# Patient Record
Sex: Male | Born: 1965
Health system: Southern US, Community
[De-identification: ages and names within clinical notes are randomized; demographics above are authoritative.]

## PROBLEM LIST (undated history)

## (undated) DIAGNOSIS — N419 Inflammatory disease of prostate, unspecified: Secondary | ICD-10-CM

## (undated) HISTORY — PX: HERNIA REPAIR: SHX51

## (undated) HISTORY — PX: OTHER SURGICAL HISTORY: SHX169

## (undated) HISTORY — PX: ACHILLES TENDON REPAIR: SUR1153

## (undated) HISTORY — DX: Inflammatory disease of prostate, unspecified: N41.9

## (undated) HISTORY — PX: TONSILLECTOMY AND ADENOIDECTOMY: SUR1326

---

## 2004-10-05 ENCOUNTER — Ambulatory Visit: Payer: Self-pay | Admitting: Family Medicine

## 2006-06-21 ENCOUNTER — Ambulatory Visit: Payer: Self-pay | Admitting: General Surgery

## 2012-12-27 DIAGNOSIS — N138 Other obstructive and reflux uropathy: Secondary | ICD-10-CM | POA: Insufficient documentation

## 2014-05-30 ENCOUNTER — Ambulatory Visit: Payer: Self-pay | Admitting: Family Medicine

## 2014-05-30 LAB — LIPID PANEL
Cholesterol: 152 mg/dL (ref 0–200)
HDL: 50 mg/dL (ref 35–70)
LDL Cholesterol: 82 mg/dL
LDL/HDL RATIO: 1.6
TRIGLYCERIDES: 100 mg/dL (ref 40–160)

## 2014-05-30 LAB — BASIC METABOLIC PANEL
BUN: 12 mg/dL (ref 4–21)
Creatinine: 1.1 mg/dL (ref 0.6–1.3)
Glucose: 104 mg/dL
Potassium: 4.5 mmol/L (ref 3.4–5.3)
SODIUM: 140 mmol/L (ref 137–147)

## 2014-05-30 LAB — CBC AND DIFFERENTIAL
HCT: 48 % (ref 41–53)
HEMOGLOBIN: 17 g/dL (ref 13.5–17.5)
NEUTROS ABS: 4 /uL
Platelets: 201 10*3/uL (ref 150–399)
WBC: 6.4 10^3/mL

## 2014-05-30 LAB — HEPATIC FUNCTION PANEL
ALT: 36 U/L (ref 10–40)
AST: 22 U/L (ref 14–40)
Alkaline Phosphatase: 50 U/L (ref 25–125)
BILIRUBIN, TOTAL: 0.4 mg/dL

## 2014-05-30 LAB — TSH: TSH: 2.42 u[IU]/mL (ref 0.41–5.90)

## 2014-05-30 LAB — PSA: PSA: 0.8

## 2015-01-13 ENCOUNTER — Other Ambulatory Visit: Payer: Self-pay | Admitting: Family Medicine

## 2015-01-21 ENCOUNTER — Other Ambulatory Visit: Payer: Self-pay | Admitting: Family Medicine

## 2015-04-29 ENCOUNTER — Other Ambulatory Visit: Payer: Self-pay | Admitting: Family Medicine

## 2015-05-27 DIAGNOSIS — J309 Allergic rhinitis, unspecified: Secondary | ICD-10-CM | POA: Insufficient documentation

## 2015-05-27 DIAGNOSIS — G4700 Insomnia, unspecified: Secondary | ICD-10-CM | POA: Insufficient documentation

## 2015-05-27 DIAGNOSIS — F4320 Adjustment disorder, unspecified: Secondary | ICD-10-CM | POA: Insufficient documentation

## 2015-05-27 DIAGNOSIS — K219 Gastro-esophageal reflux disease without esophagitis: Secondary | ICD-10-CM | POA: Insufficient documentation

## 2015-05-27 DIAGNOSIS — K589 Irritable bowel syndrome without diarrhea: Secondary | ICD-10-CM | POA: Insufficient documentation

## 2015-05-27 DIAGNOSIS — N400 Enlarged prostate without lower urinary tract symptoms: Secondary | ICD-10-CM | POA: Insufficient documentation

## 2015-06-04 ENCOUNTER — Ambulatory Visit (INDEPENDENT_AMBULATORY_CARE_PROVIDER_SITE_OTHER): Payer: BLUE CROSS/BLUE SHIELD | Admitting: Family Medicine

## 2015-06-04 ENCOUNTER — Ambulatory Visit
Admission: RE | Admit: 2015-06-04 | Discharge: 2015-06-04 | Disposition: A | Discharge status: Home / Self Care | Payer: BLUE CROSS/BLUE SHIELD | Source: Ambulatory Visit | Attending: Family Medicine | Admitting: Family Medicine

## 2015-06-04 ENCOUNTER — Encounter: Payer: Self-pay | Admitting: Family Medicine

## 2015-06-04 VITALS — BP 118/84 | HR 68 | Temp 98.1°F | Resp 14 | Ht 71.75 in | Wt 223.0 lb

## 2015-06-04 DIAGNOSIS — Z0000 Encounter for general adult medical examination without abnormal findings: Secondary | ICD-10-CM

## 2015-06-04 DIAGNOSIS — Z125 Encounter for screening for malignant neoplasm of prostate: Secondary | ICD-10-CM | POA: Diagnosis not present

## 2015-06-04 DIAGNOSIS — M5432 Sciatica, left side: Secondary | ICD-10-CM

## 2015-06-04 DIAGNOSIS — M549 Dorsalgia, unspecified: Secondary | ICD-10-CM | POA: Insufficient documentation

## 2015-06-04 DIAGNOSIS — N411 Chronic prostatitis: Secondary | ICD-10-CM | POA: Diagnosis not present

## 2015-06-04 LAB — POCT URINALYSIS DIPSTICK
Bilirubin, UA: NEGATIVE
Blood, UA: NEGATIVE
GLUCOSE UA: NEGATIVE
Ketones, UA: NEGATIVE
Nitrite, UA: NEGATIVE
PROTEIN UA: NEGATIVE
SPEC GRAV UA: 1.01
UROBILINOGEN UA: 0.2
pH, UA: 6

## 2015-06-04 MED ORDER — NAPROXEN 500 MG PO TABS: 500.0000 mg | ORAL_TABLET | Freq: Two times a day (BID) | ORAL | Status: DC

## 2015-06-04 MED ORDER — CYCLOBENZAPRINE HCL 10 MG PO TABS: 10.0000 mg | ORAL_TABLET | Freq: Every day | ORAL | Status: DC

## 2015-06-04 NOTE — Progress Notes (Signed)
Patient ID: Shawn BeachRichard N Vasquez, male   DOB: 05/03/1966, 49 y.o.   MRN: [id number]  Visit Date: 06/04/2015  Today's Provider: Megan Mansichard Gilbert Jr, MD   Chief Complaint  Patient presents with  . Annual Exam   Subjective:  Shawn Vasquez is a 49 y.o. male who presents today for health maintenance and complete physical. He feels well. 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 He reports he is sleeping well.   Review of Systems  Constitutional: Negative.   HENT: Negative.   Eyes: Negative.   Respiratory: Negative.   Cardiovascular: Negative.   Gastrointestinal: Negative.   Endocrine: Negative.   Genitourinary: Positive for frequency.  Musculoskeletal: Positive for back pain (sciatica on the left side.).  Skin: Negative.   Allergic/Immunologic: Negative.   Neurological: Negative.   Hematological: Negative.   Psychiatric/Behavioral: Negative.     Social History   Social History  . Marital Status: Married    Spouse Name: N/A  . Number of Children: N/A  . Years of Education: N/A   Occupational History  . Not on file.   Social History Main Topics  . Smoking status: Never Smoker   . Smokeless tobacco: Never Used  . Alcohol Use: Yes     Comment: occasionally  . Drug Use: No  . Sexual Activity: Yes   Other Topics Concern  . Not on file   Social History Narrative    Patient Active Problem List   Diagnosis Date Noted  . Adaptation reaction 05/27/2015  . Benign fibroma of prostate 05/27/2015  . Gastro-esophageal reflux disease without esophagitis 05/27/2015  . Adaptive colitis 05/27/2015  . Cannot sleep 05/27/2015  . Allergic rhinitis 05/27/2015  . Benign prostatic hyperplasia with urinary obstruction 12/27/2012    Past Surgical History  Procedure Laterality Date  . Hernia repair      inguinal and umbilical  . Achilles tendon repair    . Other surgical history      bronchial cleft cyst removed  . Tonsillectomy and adenoidectomy      His family  history includes Colon cancer in his paternal grandmother; Dementia in his maternal grandmother; Heart attack in his father; Lung cancer in his father; Prostate cancer in his maternal uncle; Seizures in his father; Skin cancer in his paternal grandfather.    Outpatient Prescriptions Prior to Visit  Medication Sig Dispense Refill  . ALPRAZolam (XANAX) 0.5 MG tablet Take by mouth.    . cetirizine (ZYRTEC ALLERGY) 10 MG tablet Take by mouth.    . fluticasone (FLONASE) 50 MCG/ACT nasal spray Place into the nose.    . naproxen (NAPROSYN) 500 MG tablet Take by mouth.    Marland Kitchen. omeprazole (PRILOSEC) 20 MG capsule TAKE 1 CAPSULE BY MOUTH EVERY MORNING 30 capsule 5  . tamsulosin (FLOMAX) 0.4 MG CAPS capsule 1 CAPSULE, ORAL, DAILY (Patient not taking: Reported on 06/04/2015) 90 capsule 3   No facility-administered medications prior to visit.    Patient Care Team: Maple Hudsonichard L Gilbert Jr., MD as PCP - General (Family Medicine)     Objective:   Vitals:  Filed Vitals:   06/04/15 0916  BP: 118/84  Pulse: 68  Temp: 98.1 F (36.7 C)  Resp: 14  Height: 5' 11.75" (1.822 m)  Weight: 223 lb (101.152 kg)    Physical Exam  Constitutional: He is oriented to person, place, and time. He appears well-developed and well-nourished.  HENT:  Head: Normocephalic and atraumatic.  Right Ear: External ear normal.  Left Ear:  External ear normal.  Nose: Nose normal.  Mouth/Throat: Oropharynx is clear and moist.  Eyes: Conjunctivae and EOM are normal. Pupils are equal, round, and reactive to light.  Neck: Normal range of motion. Neck supple.  Cardiovascular: Normal rate, regular rhythm, normal heart sounds and intact distal pulses.   Pulmonary/Chest: Effort normal and breath sounds normal.  Abdominal: Soft.  Genitourinary: Rectum normal, prostate normal and penis normal.  Musculoskeletal: Normal range of motion.  Neurological: He is alert and oriented to person, place, and time. He has normal reflexes. He displays  normal reflexes. No cranial nerve deficit. He exhibits normal muscle tone. Coordination normal.  Straight leg is positive on the left. Strength is normal in both legs and sensation is normal in both legs.  Skin: Skin is warm and dry.  Psychiatric: He has a normal mood and affect. His behavior is normal. Judgment and thought content normal.     Depression Screen PHQ 2/9 Scores 06/04/2015  PHQ - 2 Score 0      Assessment & Plan:   1. Annual physical exam  - PSA - TSH - POCT urinalysis dipstick - CBC with Differential/Platelet - Lipid Panel With LDL/HDL Ratio - Comprehensive metabolic panel  2. Sciatica of left side Obtain x-ray of LS-spine. Refer back to chiropractor after 2-3 weeks of treatment. He will also consider acupuncture then. Will see me back on a when necessary basis. He told him to get back in if he has weakness or numbness. - cyclobenzaprine (FLEXERIL) 10 MG tablet; Take 1 tablet (10 mg total) by mouth at bedtime.  Dispense: 30 tablet; Refill: 5 - naproxen (NAPROSYN) 500 MG tablet; Take 1 tablet (500 mg total) by mouth 2 (two) times daily with a meal.  Dispense: 60 tablet; Refill: 5 - DG Lumbar Spine 2-3 Views; Future  3. Prostate cancer screening  - PSA  4. Chronic prostatitis Check urine culture.  - Urine culture  I have done the exam and reviewed the above chart and it is accurate to the best of my knowledge.

## 2015-06-05 LAB — CBC WITH DIFFERENTIAL/PLATELET
BASOS ABS: 0 10*3/uL (ref 0.0–0.2)
Basos: 0 %
EOS (ABSOLUTE): 0.3 10*3/uL (ref 0.0–0.4)
EOS: 4 %
HEMATOCRIT: 47.5 % (ref 37.5–51.0)
HEMOGLOBIN: 16.8 g/dL (ref 12.6–17.7)
Immature Grans (Abs): 0 10*3/uL (ref 0.0–0.1)
Immature Granulocytes: 1 %
LYMPHS ABS: 1.3 10*3/uL (ref 0.7–3.1)
Lymphs: 21 %
MCH: 31.9 pg (ref 26.6–33.0)
MCHC: 35.4 g/dL (ref 31.5–35.7)
MCV: 90 fL (ref 79–97)
MONOS ABS: 0.6 10*3/uL (ref 0.1–0.9)
Monocytes: 9 %
NEUTROS ABS: 4.2 10*3/uL (ref 1.4–7.0)
Neutrophils: 65 %
Platelets: 192 10*3/uL (ref 150–379)
RBC: 5.27 x10E6/uL (ref 4.14–5.80)
RDW: 13.4 % (ref 12.3–15.4)
WBC: 6.5 10*3/uL (ref 3.4–10.8)

## 2015-06-05 LAB — PSA: Prostate Specific Ag, Serum: 1 ng/mL (ref 0.0–4.0)

## 2015-06-05 LAB — COMPREHENSIVE METABOLIC PANEL
A/G RATIO: 1.7 (ref 1.1–2.5)
ALT: 38 IU/L (ref 0–44)
AST: 24 IU/L (ref 0–40)
Albumin: 4.5 g/dL (ref 3.5–5.5)
Alkaline Phosphatase: 46 IU/L (ref 39–117)
BUN/Creatinine Ratio: 14 (ref 9–20)
BUN: 13 mg/dL (ref 6–24)
Bilirubin Total: 0.6 mg/dL (ref 0.0–1.2)
CALCIUM: 9.3 mg/dL (ref 8.7–10.2)
CO2: 25 mmol/L (ref 18–29)
Chloride: 98 mmol/L (ref 97–106)
Creatinine, Ser: 0.95 mg/dL (ref 0.76–1.27)
GFR, EST AFRICAN AMERICAN: 109 mL/min/{1.73_m2} (ref >59)
GFR, EST NON AFRICAN AMERICAN: 94 mL/min/{1.73_m2} (ref >59)
Globulin, Total: 2.7 g/dL (ref 1.5–4.5)
Glucose: 105 mg/dL — ABNORMAL HIGH (ref 65–99)
POTASSIUM: 4.6 mmol/L (ref 3.5–5.2)
SODIUM: 140 mmol/L (ref 136–144)
TOTAL PROTEIN: 7.2 g/dL (ref 6.0–8.5)

## 2015-06-05 LAB — LIPID PANEL WITH LDL/HDL RATIO
Cholesterol, Total: 158 mg/dL (ref 100–199)
HDL: 45 mg/dL (ref >39)
LDL CALC: 82 mg/dL (ref 0–99)
LDL/HDL RATIO: 1.8 ratio (ref 0.0–3.6)
TRIGLYCERIDES: 154 mg/dL — AB (ref 0–149)
VLDL Cholesterol Cal: 31 mg/dL (ref 5–40)

## 2015-06-05 LAB — TSH: TSH: 1.59 u[IU]/mL (ref 0.450–4.500)

## 2015-06-05 LAB — URINE CULTURE: ORGANISM ID, BACTERIA: NO GROWTH

## 2015-10-06 ENCOUNTER — Other Ambulatory Visit: Payer: Self-pay | Admitting: Family Medicine

## 2015-12-24 DIAGNOSIS — H524 Presbyopia: Secondary | ICD-10-CM | POA: Diagnosis not present

## 2016-04-12 ENCOUNTER — Other Ambulatory Visit: Payer: Self-pay | Admitting: Family Medicine

## 2016-04-13 MED ORDER — TAMSULOSIN HCL 0.4 MG PO CAPS: 0.4000 mg | ORAL_CAPSULE | Freq: Every day | ORAL | 3 refills | Status: DC

## 2016-04-13 NOTE — Addendum Note (Signed)
Addended by: Miachel RouxALEKSANDROVA, Tonette Koehne V on: 04/13/2016 09:02 AM   Modules accepted: Orders

## 2016-06-07 ENCOUNTER — Encounter: Payer: BLUE CROSS/BLUE SHIELD | Admitting: Family Medicine

## 2016-06-07 ENCOUNTER — Ambulatory Visit (INDEPENDENT_AMBULATORY_CARE_PROVIDER_SITE_OTHER): Payer: BLUE CROSS/BLUE SHIELD | Admitting: Family Medicine

## 2016-06-07 VITALS — BP 118/78 | HR 78 | Temp 97.6°F | Resp 16 | Ht 71.5 in | Wt 225.0 lb

## 2016-06-07 DIAGNOSIS — K219 Gastro-esophageal reflux disease without esophagitis: Secondary | ICD-10-CM | POA: Diagnosis not present

## 2016-06-07 DIAGNOSIS — Z1211 Encounter for screening for malignant neoplasm of colon: Secondary | ICD-10-CM | POA: Diagnosis not present

## 2016-06-07 DIAGNOSIS — Z0000 Encounter for general adult medical examination without abnormal findings: Secondary | ICD-10-CM | POA: Diagnosis not present

## 2016-06-07 DIAGNOSIS — Z125 Encounter for screening for malignant neoplasm of prostate: Secondary | ICD-10-CM | POA: Diagnosis not present

## 2016-06-07 LAB — POCT URINALYSIS DIPSTICK
BILIRUBIN UA: NEGATIVE
Blood, UA: NEGATIVE
GLUCOSE UA: NEGATIVE
KETONES UA: NEGATIVE
LEUKOCYTES UA: NEGATIVE
Nitrite, UA: NEGATIVE
PROTEIN UA: NEGATIVE
SPEC GRAV UA: 1.015
Urobilinogen, UA: NEGATIVE
pH, UA: 5

## 2016-06-07 MED ORDER — RANITIDINE HCL 150 MG PO CAPS: 150.0000 mg | ORAL_CAPSULE | Freq: Two times a day (BID) | ORAL | 12 refills | Status: DC

## 2016-06-07 MED ORDER — RANITIDINE HCL 150 MG PO CAPS: 150.0000 mg | ORAL_CAPSULE | Freq: Two times a day (BID) | ORAL | 3 refills | Status: DC

## 2016-06-07 NOTE — Progress Notes (Signed)
Patient: Shawn Vasquez, Male    DOB: 09/28/1965, 50 y.o.   MRN: [id number] Visit Date: 06/07/2016  Today's Provider: Megan Mansichard Victorious Kundinger Jr, MD   Chief Complaint  Patient presents with  . Annual Exam   Subjective:  Shawn BeachRichard N Cissell is a 50 y.o. male who presents today for health maintenance and complete physical. He feels well. He reports exercising daily. He reports he is sleeping well.  Immunization History  Administered Date(s) Administered  . Tdap 05/30/2014     Review of Systems  Constitutional: Negative.   HENT: Positive for congestion.   Eyes: Negative.   Respiratory: Negative.   Cardiovascular: Negative.   Gastrointestinal: Negative.   Endocrine: Negative.   Genitourinary: Negative.   Musculoskeletal: Negative.   Skin: Negative.   Allergic/Immunologic: Negative.   Neurological: Negative.   Hematological: Negative.   Psychiatric/Behavioral: Negative.     Social History   Social History  . Marital status: Married    Spouse name: N/A  . Number of children: N/A  . Years of education: N/A   Occupational History  . Not on file.   Social History Main Topics  . Smoking status: Never Smoker  . Smokeless tobacco: Never Used  . Alcohol use Yes     Comment: occasionally  . Drug use: No  . Sexual activity: Yes   Other Topics Concern  . Not on file   Social History Narrative  . No narrative on file    Patient Active Problem List   Diagnosis Date Noted  . Adaptation reaction 05/27/2015  . Benign fibroma of prostate 05/27/2015  . Gastro-esophageal reflux disease without esophagitis 05/27/2015  . Adaptive colitis 05/27/2015  . Cannot sleep 05/27/2015  . Allergic rhinitis 05/27/2015  . Benign prostatic hyperplasia with urinary obstruction 12/27/2012    Past Surgical History:  Procedure Laterality Date  . ACHILLES TENDON REPAIR    . HERNIA REPAIR     inguinal and umbilical  . OTHER SURGICAL HISTORY     bronchial cleft cyst removed  . TONSILLECTOMY AND  ADENOIDECTOMY      His family history includes Colon cancer in his paternal grandmother; Dementia in his maternal grandmother; Heart attack in his father; Lung cancer in his father; Prostate cancer in his maternal uncle; Seizures in his father; Skin cancer in his paternal grandfather.     Outpatient Encounter Prescriptions as of 06/07/2016  Medication Sig Note  . ALPRAZolam (XANAX) 0.5 MG tablet Take by mouth. 05/27/2015: Medication taken as needed. appt august/sept,please Received from: [facility]  . cetirizine (ZYRTEC ALLERGY) 10 MG tablet Take by mouth. 05/27/2015: Received from: [facility]  . cyclobenzaprine (FLEXERIL) 10 MG tablet Take 1 tablet (10 mg total) by mouth at bedtime.   . fluticasone (FLONASE) 50 MCG/ACT nasal spray 1-2 SPRAYS IN EACH NOSTRIL DAILY AS NEEDED   . naproxen (NAPROSYN) 500 MG tablet Take 1 tablet (500 mg total) by mouth 2 (two) times daily with a meal.   . omeprazole (PRILOSEC) 20 MG capsule TAKE 1 CAPSULE BY MOUTH EVERY MORNING   . tamsulosin (FLOMAX) 0.4 MG CAPS capsule Take 1 capsule (0.4 mg total) by mouth daily.   . [DISCONTINUED] naproxen (NAPROSYN) 500 MG tablet Take by mouth. 05/27/2015: Medication taken as needed.  Received from[facility]   No facility-administered encounter medications on file as of 06/07/2016.     Patient Care Team: Maple Hudsonichard L Pernell Dikes Jr., MD as PCP - General (Family Medicine)      Objective:  Vitals:  Vitals:   06/07/16 0851  BP: 118/78  Pulse: 78  Resp: 16  Temp: 97.6 F (36.4 C)  TempSrc: Oral  Weight: 225 lb (102.1 kg)  Height: 5' 11.5" (1.816 m)    Physical Exam  Constitutional: He is oriented to person, place, and time. He appears well-developed and well-nourished.  HENT:  Head: Normocephalic and atraumatic.  Right Ear: External ear normal.  Left Ear: External ear normal.  Nose: Nose normal.  Mouth/Throat: Oropharynx is clear and moist.  Eyes:  Conjunctivae and EOM are normal. Pupils are equal, round, and reactive to light.  Neck: Normal range of motion. Neck supple.  Cardiovascular: Normal rate, regular rhythm, normal heart sounds and intact distal pulses.   Pulmonary/Chest: Effort normal and breath sounds normal.  Abdominal: Soft. Bowel sounds are normal.  Genitourinary: Rectum normal, prostate normal and penis normal.  Musculoskeletal: Normal range of motion.  Neurological: He is alert and oriented to person, place, and time. He has normal reflexes.  Skin: Skin is warm and dry.  Psychiatric: He has a normal mood and affect. His behavior is normal. Judgment and thought content normal.     Depression Screen PHQ 2/9 Scores 06/07/2016 06/04/2015  PHQ - 2 Score 0 0   Current Exercise Habits: The patient has a physically strenous job, but has no regular exercise apart from work.    Fall Risk  06/07/2016 06/04/2015  Falls in the past year? Yes No   Functional Status Survey: Is the patient deaf or have difficulty hearing?: No Does the patient have difficulty seeing, even when wearing glasses/contacts?: No Does the patient have difficulty concentrating, remembering, or making decisions?: No Does the patient have difficulty walking or climbing stairs?: No Does the patient have difficulty dressing or bathing?: No Does the patient have difficulty doing errands alone such as visiting a doctor's office or shopping?: No  .   Assessment & Plan:     Routine Health Maintenance and Physical Exam  Exercise Activities and Dietary recommendations Goals    None      Immunization History  Administered Date(s) Administered  . Tdap 05/30/2014    Health Maintenance  Topic Date Due  . HIV Screening  08/03/1981  . INFLUENZA VACCINE  03/01/2016  . TETANUS/TDAP  05/30/2024     Discussed health benefits of physical activity, and encouraged him to engage in regular exercise appropriate for his age and condition.    1. Annual  physical exam  - CBC with Differential/Platelet - Comprehensive metabolic panel - Lipid Panel With LDL/HDL Ratio - TSH  2. Prostate cancer screening  - PSA  3. Colon cancer screening   4. Gastroesophageal reflux disease, esophagitis presence not specified  - ranitidine (ZANTAC) 150 MG capsule; Take 1 capsule (150 mg total) by mouth 2 (two) times daily.  Dispense: 180 capsule; Refill: 3    HPI, Exam and A&P Transcribed under the direction and in the presence of Julieanne Mansonichard Kynzli Rease, Montez HagemanJr., MD. Electronically Signed: Janey GreaserElena DeSanto, RMA I have done the exam and reviewed the chart and it is accurate to the best of my knowledge. Julieanne Mansonichard Elliot Simoneaux M.D. [facility]

## 2016-06-08 LAB — TSH: TSH: 1.64 u[IU]/mL (ref 0.450–4.500)

## 2016-06-08 LAB — CBC WITH DIFFERENTIAL/PLATELET
Basophils Absolute: 0 10*3/uL (ref 0.0–0.2)
Basos: 0 %
EOS (ABSOLUTE): 0.3 10*3/uL (ref 0.0–0.4)
Eos: 3 %
Hematocrit: 47.1 % (ref 37.5–51.0)
Hemoglobin: 16.9 g/dL (ref 12.6–17.7)
IMMATURE GRANULOCYTES: 0 %
Immature Grans (Abs): 0 10*3/uL (ref 0.0–0.1)
LYMPHS ABS: 1.6 10*3/uL (ref 0.7–3.1)
Lymphs: 21 %
MCH: 31.9 pg (ref 26.6–33.0)
MCHC: 35.9 g/dL — AB (ref 31.5–35.7)
MCV: 89 fL (ref 79–97)
MONOS ABS: 0.7 10*3/uL (ref 0.1–0.9)
Monocytes: 10 %
NEUTROS PCT: 66 %
Neutrophils Absolute: 4.8 10*3/uL (ref 1.4–7.0)
PLATELETS: 180 10*3/uL (ref 150–379)
RBC: 5.29 x10E6/uL (ref 4.14–5.80)
RDW: 12.7 % (ref 12.3–15.4)
WBC: 7.3 10*3/uL (ref 3.4–10.8)

## 2016-06-08 LAB — LIPID PANEL WITH LDL/HDL RATIO
CHOLESTEROL TOTAL: 180 mg/dL (ref 100–199)
HDL: 50 mg/dL (ref >39)
LDL CALC: 104 mg/dL — AB (ref 0–99)
LDl/HDL Ratio: 2.1 ratio units (ref 0.0–3.6)
TRIGLYCERIDES: 132 mg/dL (ref 0–149)
VLDL CHOLESTEROL CAL: 26 mg/dL (ref 5–40)

## 2016-06-08 LAB — COMPREHENSIVE METABOLIC PANEL
A/G RATIO: 1.9 (ref 1.2–2.2)
ALK PHOS: 47 IU/L (ref 39–117)
ALT: 36 IU/L (ref 0–44)
AST: 21 IU/L (ref 0–40)
Albumin: 4.7 g/dL (ref 3.5–5.5)
BUN/Creatinine Ratio: 12 (ref 9–20)
BUN: 12 mg/dL (ref 6–24)
Bilirubin Total: 0.8 mg/dL (ref 0.0–1.2)
CALCIUM: 9.5 mg/dL (ref 8.7–10.2)
CO2: 25 mmol/L (ref 18–29)
Chloride: 97 mmol/L (ref 96–106)
Creatinine, Ser: 1.04 mg/dL (ref 0.76–1.27)
GFR calc Af Amer: 97 mL/min/{1.73_m2} (ref >59)
GFR, EST NON AFRICAN AMERICAN: 84 mL/min/{1.73_m2} (ref >59)
Globulin, Total: 2.5 g/dL (ref 1.5–4.5)
Glucose: 102 mg/dL — ABNORMAL HIGH (ref 65–99)
POTASSIUM: 4.2 mmol/L (ref 3.5–5.2)
SODIUM: 137 mmol/L (ref 134–144)
Total Protein: 7.2 g/dL (ref 6.0–8.5)

## 2016-06-08 LAB — PSA: PROSTATE SPECIFIC AG, SERUM: 0.7 ng/mL (ref 0.0–4.0)

## 2016-06-09 ENCOUNTER — Telehealth: Payer: Self-pay

## 2016-06-09 NOTE — Telephone Encounter (Signed)
Pt advised.   Thanks,   -Lanett Lasorsa  

## 2016-06-09 NOTE — Telephone Encounter (Signed)
LMTCB 06/09/2016  Thanks,   -Nancyjo Givhan  

## 2016-06-09 NOTE — Telephone Encounter (Signed)
-----   Message from Maple Hudsonichard L Gilbert Jr., MD sent at 06/08/2016  8:01 AM EST ----- Labs stable. Mild prediabetes so work on diet and exercise with a goal of some weight loss.

## 2016-08-26 ENCOUNTER — Ambulatory Visit
Admission: RE | Admit: 2016-08-26 | Discharge: 2016-08-26 | Disposition: A | Discharge status: Home / Self Care | Payer: BLUE CROSS/BLUE SHIELD | Source: Ambulatory Visit | Attending: Family Medicine | Admitting: Family Medicine

## 2016-08-26 ENCOUNTER — Telehealth: Payer: Self-pay

## 2016-08-26 ENCOUNTER — Ambulatory Visit (INDEPENDENT_AMBULATORY_CARE_PROVIDER_SITE_OTHER): Payer: BLUE CROSS/BLUE SHIELD | Admitting: Family Medicine

## 2016-08-26 ENCOUNTER — Encounter: Payer: Self-pay | Admitting: Family Medicine

## 2016-08-26 VITALS — BP 110/72 | HR 106 | Temp 98.2°F | Resp 18 | Wt 226.0 lb

## 2016-08-26 DIAGNOSIS — R918 Other nonspecific abnormal finding of lung field: Secondary | ICD-10-CM | POA: Diagnosis not present

## 2016-08-26 DIAGNOSIS — R05 Cough: Secondary | ICD-10-CM

## 2016-08-26 DIAGNOSIS — J189 Pneumonia, unspecified organism: Secondary | ICD-10-CM | POA: Diagnosis not present

## 2016-08-26 DIAGNOSIS — R6883 Chills (without fever): Secondary | ICD-10-CM | POA: Diagnosis not present

## 2016-08-26 DIAGNOSIS — R059 Cough, unspecified: Secondary | ICD-10-CM

## 2016-08-26 LAB — POC INFLUENZA A&B (BINAX/QUICKVUE)
Influenza A, POC: NEGATIVE
Influenza B, POC: NEGATIVE

## 2016-08-26 MED ORDER — AZITHROMYCIN 250 MG PO TABS: ORAL_TABLET | ORAL | 0 refills | Status: DC

## 2016-08-26 MED ORDER — HYDROCOD POLST-CPM POLST ER 10-8 MG/5ML PO SUER: 5.0000 mL | Freq: Two times a day (BID) | ORAL | 0 refills | Status: DC | PRN

## 2016-08-26 NOTE — Progress Notes (Signed)
Subjective:  HPI Pt is here today for cough and congestion. He reports that it started suddenly about 3 days ago. He has chills, low grade fever, cough, some "raddling" in his chest, some shortness of breath and congestion. He denies body aches. He has not been able to sleep because he is coughing so much.   Prior to Admission medications   Medication Sig Start Date End Date Taking? Authorizing Provider  ALPRAZolam Prudy Feeler(XANAX) 0.5 MG tablet Take by mouth. 02/14/13  Yes Historical Provider, MD  cetirizine (ZYRTEC ALLERGY) 10 MG tablet Take by mouth. 03/07/12  Yes Historical Provider, MD  cyclobenzaprine (FLEXERIL) 10 MG tablet Take 1 tablet (10 mg total) by mouth at bedtime. 06/04/15  Yes Joncarlo Hulen ShoutsL Gilbert Jr., MD  fluticasone [facility](FLONASE) 50 MCG/ACT nasal spray 1-2 SPRAYS IN EACH NOSTRIL DAILY AS NEEDED 10/06/15  Yes Berdell Hulen ShoutsL Gilbert Jr., MD  naproxen (NAPROSYN) 500 MG tablet Take 1 tablet (500 mg total) by mouth 2 (two) times daily with a meal. 06/04/15  Yes Maple Hudsonichard L Gilbert Jr., MD  omeprazole (PRILOSEC) 20 MG capsule Take 20 mg by mouth every morning. 06/01/16  Yes Historical Provider, MD  ranitidine (ZANTAC) 150 MG capsule Take 1 capsule (150 mg total) by mouth 2 (two) times daily. 06/07/16  Yes Karis Hulen ShoutsL Gilbert Jr., MD  tamsulosin (FLOMAX) 0.4 MG CAPS capsule Take 1 capsule (0.4 mg total) by mouth daily. 04/13/16  Yes Angle Hulen ShoutsL Gilbert Jr., MD    Patient Active Problem List   Diagnosis Date Noted  . Adaptation reaction 05/27/2015  . Benign fibroma of prostate 05/27/2015  . Gastro-esophageal reflux disease without esophagitis 05/27/2015  . Adaptive colitis 05/27/2015  . Cannot sleep 05/27/2015  . Allergic rhinitis 05/27/2015  . Benign prostatic hyperplasia with urinary obstruction 12/27/2012    History reviewed. No pertinent past medical history.  Social History   Social History  . Marital status: Married    Spouse name: N/A  . Number of children: N/A  . Years of education: N/A    Occupational History  . Not on file.   Social History Main Topics  . Smoking status: Never Smoker  . Smokeless tobacco: Never Used  . Alcohol use Yes     Comment: occasionally  . Drug use: No  . Sexual activity: Yes   Other Topics Concern  . Not on file   Social History Narrative  . No narrative on file    No Known Allergies  Review of Systems  Constitutional: Positive for fever (low grade) and malaise/fatigue.  HENT: Positive for congestion.   Eyes: Negative.   Respiratory: Positive for cough, shortness of breath and wheezing.   Cardiovascular: Negative.   Gastrointestinal: Negative.   Genitourinary: Negative.   Musculoskeletal: Negative.   Skin: Negative.   Neurological: Negative.   Endo/Heme/Allergies: Negative.   Psychiatric/Behavioral: Negative.     Immunization History  Administered Date(s) Administered  . Tdap 05/30/2014    Objective:  BP 110/72 (BP Location: Left Arm, Patient Position: Sitting, Cuff Size: Large)   Pulse (!) 106   Temp 98.2 F (36.8 C) (Oral)   Resp 18   Wt 226 lb (102.5 kg)   SpO2 94%   BMI 31.08 kg/m   Physical Exam  Constitutional: He is oriented to person, place, and time and well-developed, well-nourished, and in no distress.  HENT:  Head: Normocephalic and atraumatic.  Right Ear: External ear normal.  Left Ear: External ear normal.  Nose: Nose normal.  Mouth/Throat: Oropharynx is  clear and moist.  Eyes: Conjunctivae are normal. No scleral icterus.  Neck: No thyromegaly present.  Cardiovascular: Normal rate, regular rhythm and normal heart sounds.   Pulmonary/Chest: Effort normal and breath sounds normal.  Abdominal: Soft.  Neurological: He is alert and oriented to person, place, and time. Gait normal. GCS score is 15.  Skin: Skin is warm and dry.  Psychiatric: Mood, memory, affect and judgment normal.    Lab Results  Component Value Date   WBC 7.3 06/07/2016   HGB 17.0 05/30/2014   HCT 47.1 06/07/2016   PLT 180  06/07/2016   GLUCOSE 102 (H) 06/07/2016   CHOL 180 06/07/2016   TRIG 132 06/07/2016   HDL 50 06/07/2016   LDLCALC 104 (H) 06/07/2016   TSH 1.640 06/07/2016   PSA 0.8 05/30/2014    CMP     Component Value Date/Time   NA 137 06/07/2016 0932   K 4.2 06/07/2016 0932   CL 97 06/07/2016 0932   CO2 25 06/07/2016 0932   GLUCOSE 102 (H) 06/07/2016 0932   BUN 12 06/07/2016 0932   CREATININE 1.04 06/07/2016 0932   CALCIUM 9.5 06/07/2016 0932   PROT 7.2 06/07/2016 0932   ALBUMIN 4.7 06/07/2016 0932   AST 21 06/07/2016 0932   ALT 36 06/07/2016 0932   ALKPHOS 47 06/07/2016 0932   BILITOT 0.8 06/07/2016 0932   GFRNONAA 84 06/07/2016 0932   GFRAA 97 06/07/2016 0932    Assessment and Plan :  1. Cough  - chlorpheniramine-HYDROcodone (TUSSIONEX PENNKINETIC ER) 10-8 MG/5ML SUER; Take 5 mLs by mouth every 12 (twelve) hours as needed for cough.  Dispense: 140 mL; Refill: 0 - DG Chest 2 View; Future  2. Chills  - POC Influenza A&B(BINAX/QUICKVUE)--negative.  3. Walking pneumonia  - azithromycin (ZITHROMAX) 250 MG tablet; Take 2 tablets today then take one daily until finished.  Dispense: 6 tablet; Refill: 0 - DG Chest 2 View; Future  I have done the exam and reviewed the above chart and it is accurate to the best of my knowledge. Dentist has been used in this note in any air is in the dictation or transcription are unintentional.  Julieanne Manson MD [facility] 08/26/2016 9:04 AM

## 2016-08-26 NOTE — Telephone Encounter (Signed)
Patient calling about chest xray results-aa

## 2016-08-29 ENCOUNTER — Telehealth: Payer: Self-pay

## 2016-08-29 DIAGNOSIS — R062 Wheezing: Secondary | ICD-10-CM

## 2016-08-29 MED ORDER — ALBUTEROL SULFATE (2.5 MG/3ML) 0.083% IN NEBU: 2.5000 mg | INHALATION_SOLUTION | RESPIRATORY_TRACT | 1 refills | Status: DC | PRN

## 2016-08-29 NOTE — Telephone Encounter (Signed)
-----   Message from Maple Hudsonichard L Gilbert Jr., MD sent at 08/29/2016 10:24 AM EST ----- Albuterol via nebulizer every 4 hours when necessary wheezing. Dispense one box. 1 refill.

## 2016-08-29 NOTE — Telephone Encounter (Signed)
Sent in and pt advised. Allene DillonEmily Drozdowski, CMA

## 2016-09-12 ENCOUNTER — Ambulatory Visit: Payer: BLUE CROSS/BLUE SHIELD | Admitting: Family Medicine

## 2016-10-04 ENCOUNTER — Other Ambulatory Visit: Payer: Self-pay | Admitting: Family Medicine

## 2016-10-04 DIAGNOSIS — M5432 Sciatica, left side: Secondary | ICD-10-CM

## 2016-10-19 IMAGING — CR DG LUMBAR SPINE 2-3V
1 series · 3 of 3 positions shown · non-contrast
Comparison: None.

CLINICAL DATA: Low back pain centered over the left SI joint region
with radiation into the left leg, history of sciatic symptoms, no
history of injury.

EXAM:
LUMBAR SPINE - 2-3 VIEW

[Series 1: dg lumbar spine 2-3 views · 0.14mm/px · 3 of 3 slices shown]
[im 1/3]
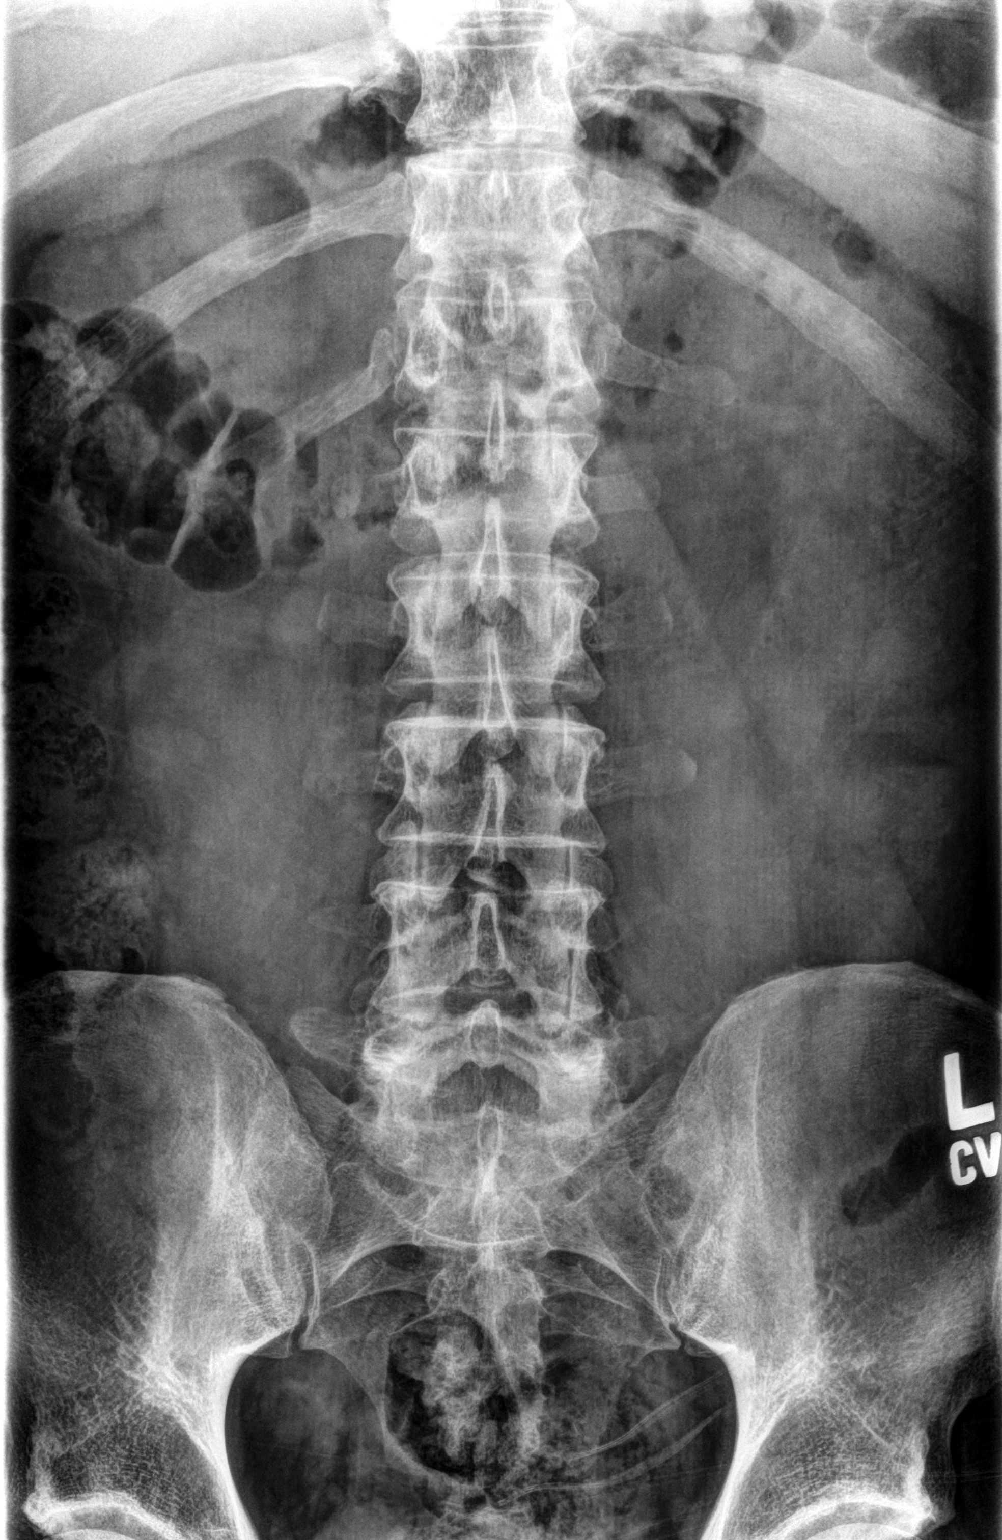
[im 2/3]
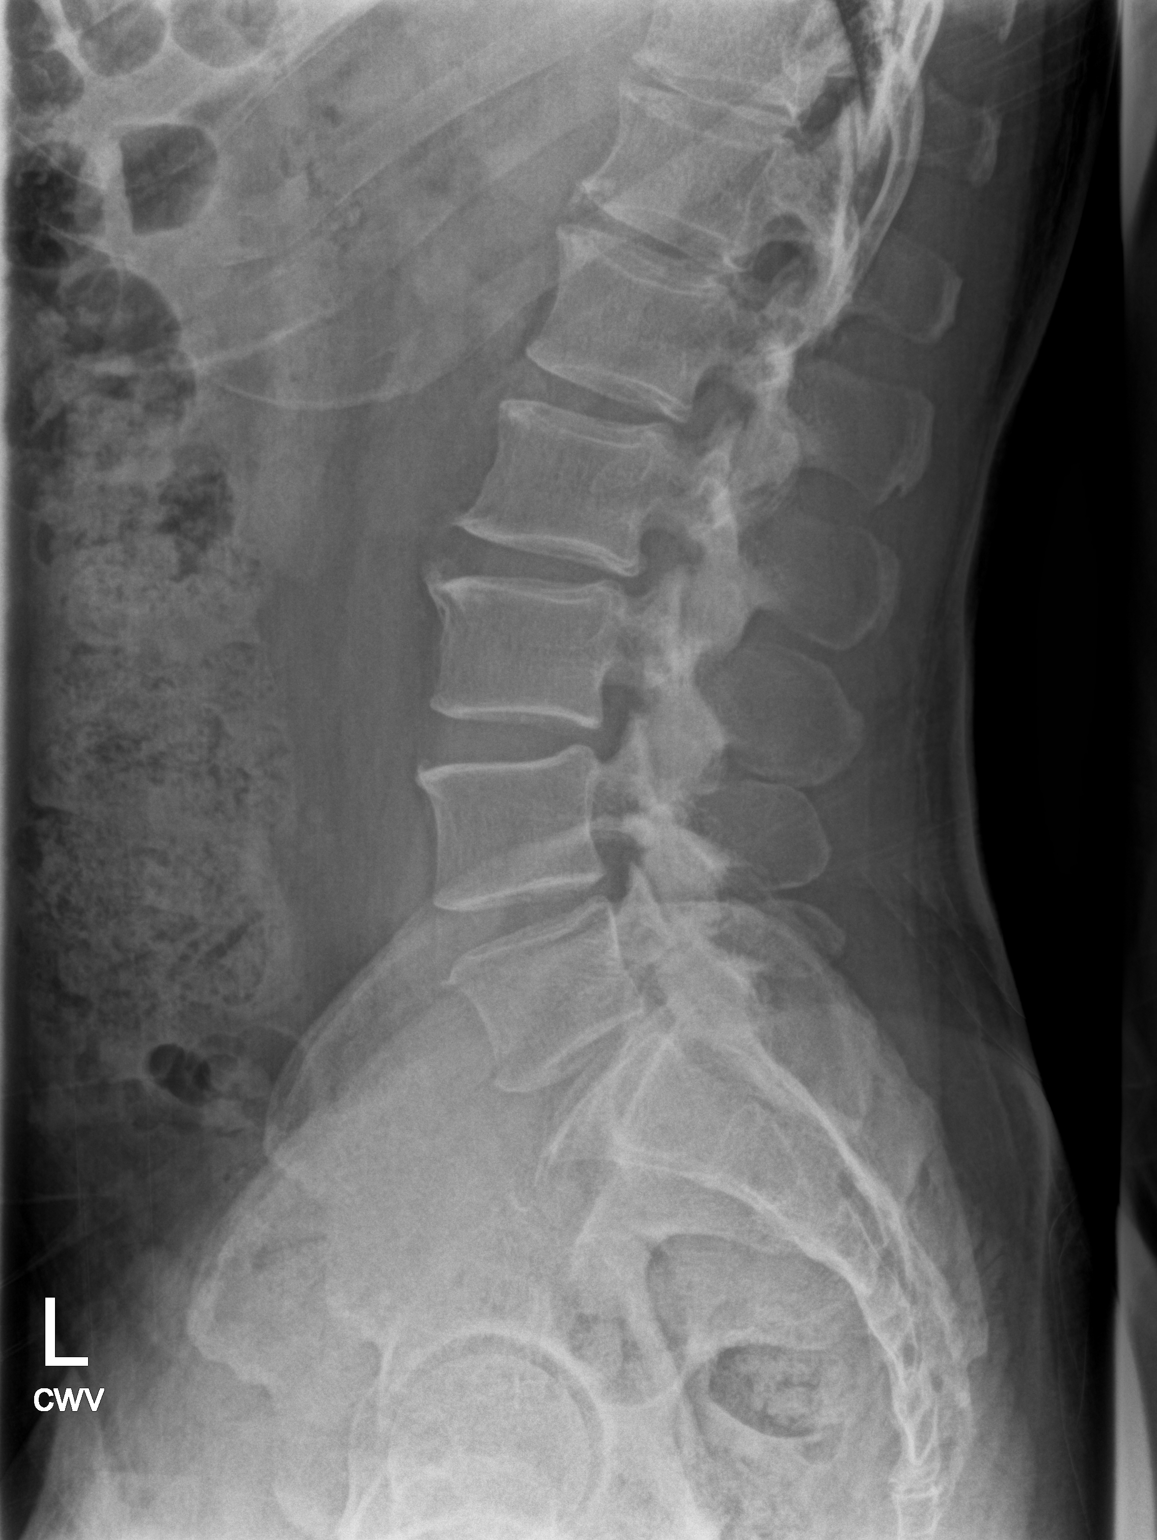
[im 3/3]
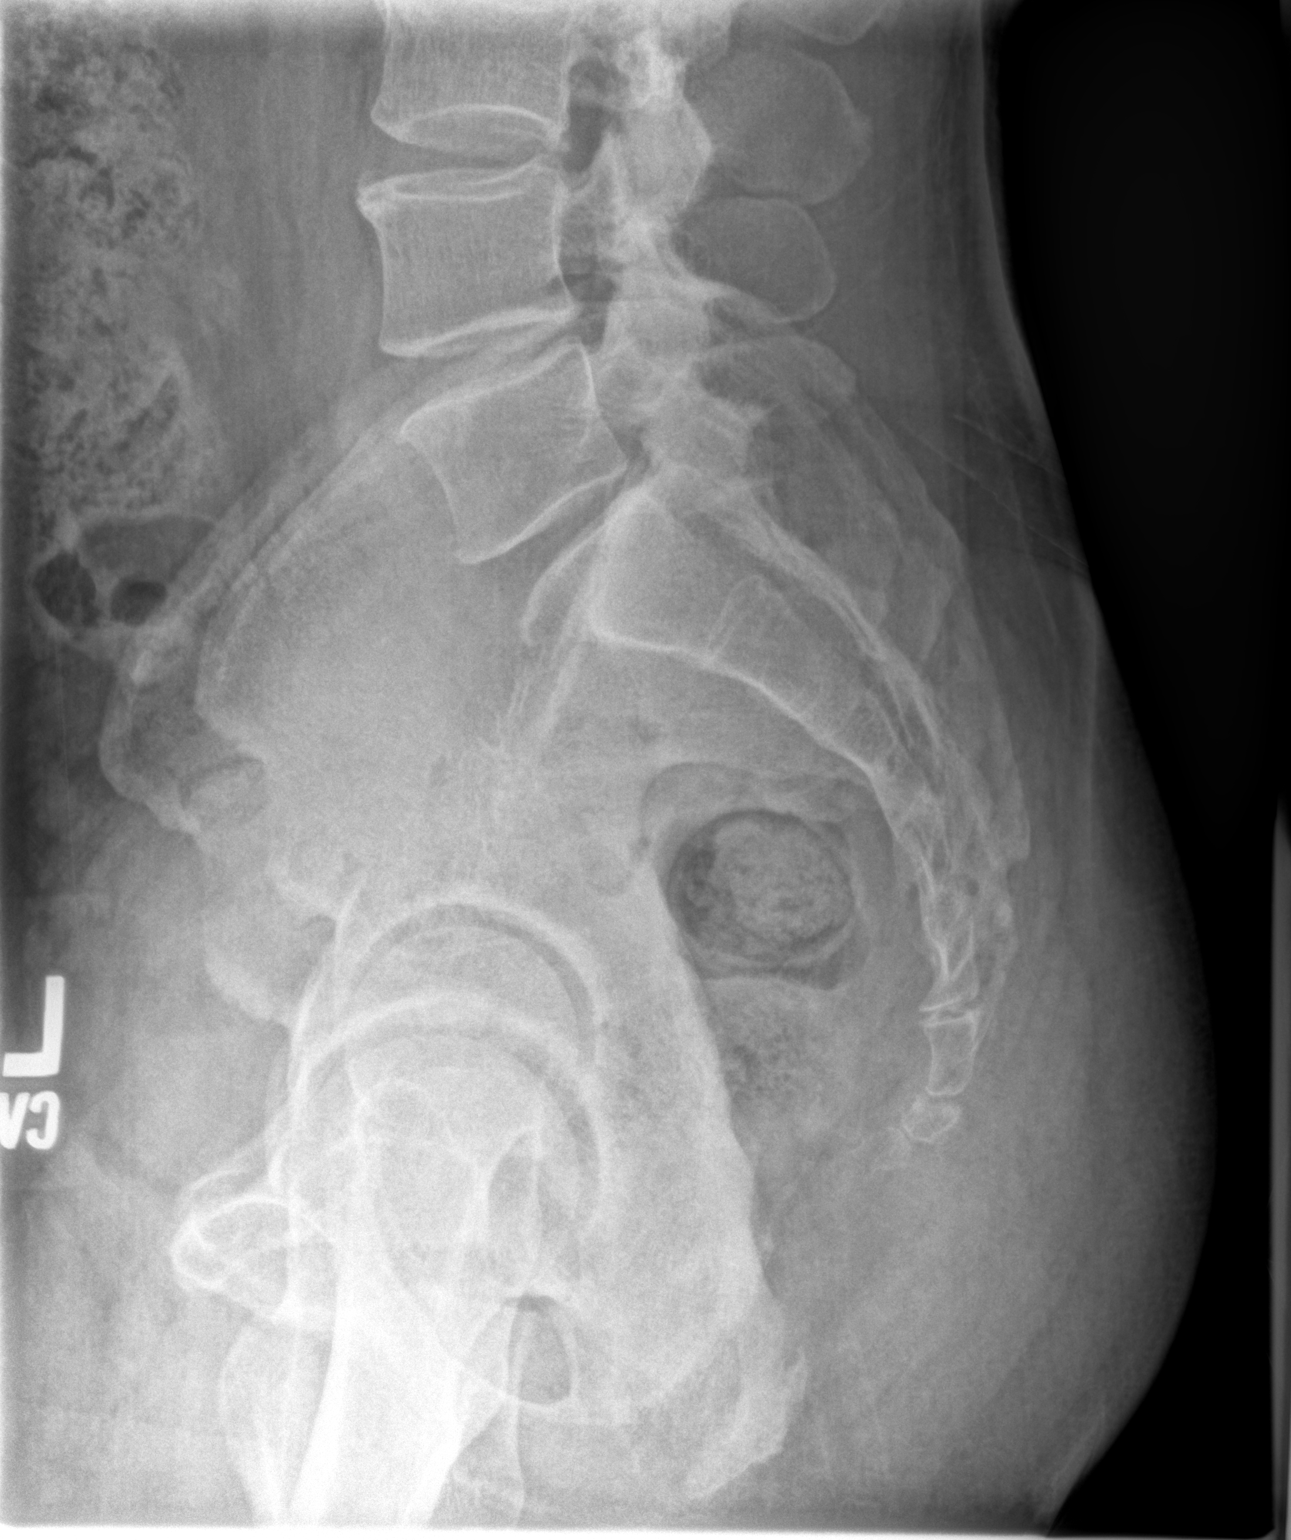

[3 of 3 positions shown; findings below may reference images not displayed]

FINDINGS: The lumbar vertebral bodies are preserved in height. The disc space
heights are well maintained. There small anterior endplate
osteophytes at L3 and at L4. There is no spondylolisthesis. The
pedicles and transverse processes are intact. There is no facet
joint hypertrophy.

The observed portions of the sacrum and SI joints exhibit no acute
or significant chronic abnormality.
IMPRESSION: There is no acute or significant chronic bony abnormality of the
lumbar spine. Given the sciatic symptoms, MRI would may be a useful
next imaging step. If there is point tenderness over the SI joints,
and SI joint series would be useful.

## 2016-10-22 ENCOUNTER — Other Ambulatory Visit: Payer: Self-pay | Admitting: Family Medicine

## 2016-11-29 ENCOUNTER — Other Ambulatory Visit: Payer: Self-pay | Admitting: Family Medicine

## 2017-04-08 ENCOUNTER — Other Ambulatory Visit: Payer: Self-pay | Admitting: Family Medicine

## 2017-04-11 ENCOUNTER — Other Ambulatory Visit: Payer: Self-pay | Admitting: Family Medicine

## 2017-06-08 ENCOUNTER — Ambulatory Visit (INDEPENDENT_AMBULATORY_CARE_PROVIDER_SITE_OTHER): Payer: BLUE CROSS/BLUE SHIELD | Admitting: Family Medicine

## 2017-06-08 ENCOUNTER — Encounter: Payer: Self-pay | Admitting: Family Medicine

## 2017-06-08 VITALS — BP 114/82 | HR 72 | Temp 98.2°F | Resp 16 | Ht 71.25 in | Wt 225.6 lb

## 2017-06-08 DIAGNOSIS — Z125 Encounter for screening for malignant neoplasm of prostate: Secondary | ICD-10-CM

## 2017-06-08 DIAGNOSIS — Z0000 Encounter for general adult medical examination without abnormal findings: Secondary | ICD-10-CM

## 2017-06-08 DIAGNOSIS — Z2821 Immunization not carried out because of patient refusal: Secondary | ICD-10-CM

## 2017-06-08 DIAGNOSIS — Z1211 Encounter for screening for malignant neoplasm of colon: Secondary | ICD-10-CM

## 2017-06-08 DIAGNOSIS — R5383 Other fatigue: Secondary | ICD-10-CM

## 2017-06-08 DIAGNOSIS — R739 Hyperglycemia, unspecified: Secondary | ICD-10-CM

## 2017-06-08 LAB — POCT URINALYSIS DIPSTICK
Bilirubin, UA: NEGATIVE
Blood, UA: NEGATIVE
GLUCOSE UA: NEGATIVE
KETONES UA: NEGATIVE
LEUKOCYTES UA: NEGATIVE
Nitrite, UA: NEGATIVE
PROTEIN UA: NEGATIVE
SPEC GRAV UA: 1.01 (ref 1.010–1.025)
Urobilinogen, UA: 0.2 E.U./dL
pH, UA: 6 (ref 5.0–8.0)

## 2017-06-08 MED ORDER — OMEPRAZOLE 20 MG PO CPDR: 20.0000 mg | DELAYED_RELEASE_CAPSULE | Freq: Every morning | ORAL | 3 refills | Status: DC

## 2017-06-08 NOTE — Patient Instructions (Signed)
Take Omeprazole 1 in the morning and Ranitidine 1 in the evening. You will get a call in regards to your appointment for colonoscopy.

## 2017-06-08 NOTE — Progress Notes (Signed)
Patient: Shawn Vasquez Snedeker, Male    DOB: 01/31/1966, 51 y.o.   MRN: [id number] Visit Date: 06/08/2017  Today's Provider: Megan Mansichard Rani Idler Jr, MD   Chief Complaint  Patient presents with  . Annual Exam   Subjective:  Shawn Vasquez Zepeda is a 51 y.o. male who presents today for health maintenance and complete physical. He feels well. 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 He reports he is sleeping well.  Just turned 51 this year and needs colonoscopy set up. Patient said he had Colonoscopy also in 30s.  Review of Systems  Constitutional: Negative.   HENT: Positive for congestion.   Eyes: Negative.   Respiratory: Negative.   Cardiovascular: Negative.   Gastrointestinal: Negative.        Chronic heartburn.  Endocrine: Negative.   Genitourinary: Negative.   Musculoskeletal: Negative.   Skin: Negative.   Allergic/Immunologic: Negative.   Neurological: Negative.   Hematological: Negative.   Psychiatric/Behavioral: Negative.     Social History   Socioeconomic History  . Marital status: Married    Spouse name: Not on file  . Number of children: Not on file  . Years of education: Not on file  . Highest education level: Not on file  Social Needs  . Financial resource strain: Not on file  . Food insecurity - worry: Not on file  . Food insecurity - inability: Not on file  . Transportation needs - medical: Not on file  . Transportation needs - non-medical: Not on file  Occupational History  . Not on file  Tobacco Use  . Smoking status: Never Smoker  . Smokeless tobacco: Never Used  Substance and Sexual Activity  . Alcohol use: Yes    Comment: occasionally  . Drug use: No  . Sexual activity: Yes  Other Topics Concern  . Not on file  Social History Narrative  . Not on file    Patient Active Problem List   Diagnosis Date Noted  . Adaptation reaction 05/27/2015  . Benign fibroma of prostate 05/27/2015  . Gastro-esophageal reflux disease without esophagitis 05/27/2015  . Adaptive  colitis 05/27/2015  . Cannot sleep 05/27/2015  . Allergic rhinitis 05/27/2015  . Benign prostatic hyperplasia with urinary obstruction 12/27/2012    Past Surgical History:  Procedure Laterality Date  . ACHILLES TENDON REPAIR    . HERNIA REPAIR     inguinal and umbilical  . OTHER SURGICAL HISTORY     bronchial cleft cyst removed  . TONSILLECTOMY AND ADENOIDECTOMY      His family history includes Colon cancer in his paternal grandmother; Dementia in his maternal grandmother; Heart attack in his father; Lung cancer in his father; Prostate cancer in his maternal uncle; Seizures in his father; Skin cancer in his paternal grandfather.     Outpatient Encounter Medications as of 06/08/2017  Medication Sig Note  . cetirizine (ZYRTEC ALLERGY) 10 MG tablet Take by mouth. 05/27/2015: Received from: [facility]  . fluticasone (FLONASE) 50 MCG/ACT nasal spray 1-2 SPRAYS IN EACH NOSTRIL DAILY AS NEEDED   . omeprazole (PRILOSEC) 20 MG capsule TAKE 1 CAPSULE BY MOUTH EVERY MORNING   . ranitidine (ZANTAC) 150 MG capsule Take 1 capsule (150 mg total) by mouth 2 (two) times daily.   Marland Kitchen. ALPRAZolam (XANAX) 0.5 MG tablet Take by mouth. 05/27/2015: Medication taken as needed. appt august/sept,please Received from: [facility]  . tamsulosin (FLOMAX) 0.4 MG CAPS capsule TAKE 1 CAPSULE (0.4 MG TOTAL) BY MOUTH DAILY. (Patient not taking: Reported on 06/08/2017)   . [  DISCONTINUED] albuterol (PROVENTIL) (2.5 MG/3ML) 0.083% nebulizer solution Take 3 mLs (2.5 mg total) by nebulization every 4 (four) hours as needed for wheezing or shortness of breath.   . [DISCONTINUED] azithromycin (ZITHROMAX) 250 MG tablet Take 2 tablets today then take one daily until finished.   . [DISCONTINUED] chlorpheniramine-HYDROcodone (TUSSIONEX PENNKINETIC ER) 10-8 MG/5ML SUER Take 5 mLs by mouth every 12 (twelve) hours as needed for cough.   . [DISCONTINUED] cyclobenzaprine (FLEXERIL) 10 MG tablet TAKE 1  TABLET (10 MG TOTAL) BY MOUTH AT BEDTIME.   . [DISCONTINUED] fluticasone (FLONASE) 50 MCG/ACT nasal spray 1-2 SPRAYS IN EACH NOSTRIL DAILY AS NEEDED   . [DISCONTINUED] naproxen (NAPROSYN) 500 MG tablet Take 1 tablet (500 mg total) by mouth 2 (two) times daily with a meal.   . [DISCONTINUED] omeprazole (PRILOSEC) 20 MG capsule Take 20 mg by mouth every morning.    No facility-administered encounter medications on file as of 06/08/2017.     Patient Care Team: Maple HudsonGilbert, Josedejesus L Jr., MD as PCP - General (Family Medicine)      Objective:   Vitals:  Vitals:   06/08/17 0851  BP: 114/82  Pulse: 72  Resp: 16  Temp: 98.2 F (36.8 C)  Weight: 225 lb 9.6 oz (102.3 kg)  Height: 5' 11.25" (1.81 m)    Physical Exam  Constitutional: He is oriented to person, place, and time. He appears well-developed and well-nourished.  HENT:  Head: Normocephalic and atraumatic.  Right Ear: External ear normal.  Left Ear: External ear normal.  Eyes: Conjunctivae are normal. Pupils are equal, round, and reactive to light.  Neck: Normal range of motion. Neck supple.  Cardiovascular: Normal rate, regular rhythm, normal heart sounds and intact distal pulses. Exam reveals no gallop.  No murmur heard. Pulmonary/Chest: Effort normal and breath sounds normal. No respiratory distress. He has no wheezes.  Abdominal: Soft. He exhibits no distension. There is no tenderness.  Musculoskeletal: He exhibits no edema or tenderness.  Neurological: He is alert and oriented to person, place, and time. No cranial nerve deficit. Coordination normal.  Skin: No rash noted. No erythema.  Psychiatric: He has a normal mood and affect. His behavior is normal. Judgment and thought content normal.  Depression Screen PHQ 2/9 Scores 06/08/2017 06/07/2016 06/04/2015  PHQ - 2 Score 0 0 0  PHQ- 9 Score 0 - -    Assessment & Plan:    1. Annual physical exam - CBC with Differential/Platelet - Comprehensive metabolic panel - Lipid  Profile - TSH  2. Colon cancer screening Refer to Dr Norma Fredricksontoledo for colonoscopy - Ambulatory referral to Gastroenterology  3. Prostate cancer screening - PSA  4. Hyperglycemia - HgB A1c  5. Influenza vaccination declined by patient 6. Other fatigue Epworth score today is 3. Check labs. Follow up 2 months-  Testosterone - Iron  7. GERD Start back on Omeprazole 1 in the morning and continue Ranitidine 1 in the evening.  HPI, Exam and A&P transcribed by Domingo CockingAnastasiya Hopkins, RMA under direction and in the presence of Julieanne Mansonichard Apple Dearmas, MD. I have done the exam and reviewed the chart and it is accurate to the best of my knowledge. DentistDragon  technology has been used and  any errors in dictation or transcription are unintentional. Julieanne Mansonichard Kanyon Bunn M.D. [facility]

## 2017-06-09 LAB — HEMOGLOBIN A1C
Hgb A1c MFr Bld: 5.1 % of total Hgb (ref <5.7)
Mean Plasma Glucose: 100 (calc)
eAG (mmol/L): 5.5 (calc)

## 2017-06-09 LAB — CBC WITH DIFFERENTIAL/PLATELET
BASOS ABS: 48 {cells}/uL (ref 0–200)
BASOS PCT: 0.7 %
EOS PCT: 3.5 %
Eosinophils Absolute: 238 cells/uL (ref 15–500)
HEMATOCRIT: 47.2 % (ref 38.5–50.0)
HEMOGLOBIN: 16.8 g/dL (ref 13.2–17.1)
LYMPHS ABS: 1510 {cells}/uL (ref 850–3900)
MCH: 31.8 pg (ref 27.0–33.0)
MCHC: 35.6 g/dL (ref 32.0–36.0)
MCV: 89.4 fL (ref 80.0–100.0)
MPV: 10.4 fL (ref 7.5–12.5)
Monocytes Relative: 8.7 %
NEUTROS ABS: 4413 {cells}/uL (ref 1500–7800)
Neutrophils Relative %: 64.9 %
Platelets: 203 10*3/uL (ref 140–400)
RBC: 5.28 10*6/uL (ref 4.20–5.80)
RDW: 12.2 % (ref 11.0–15.0)
Total Lymphocyte: 22.2 %
WBC mixed population: 592 cells/uL (ref 200–950)
WBC: 6.8 10*3/uL (ref 3.8–10.8)

## 2017-06-09 LAB — COMPLETE METABOLIC PANEL WITH GFR
AG Ratio: 1.6 (calc) (ref 1.0–2.5)
ALBUMIN MSPROF: 4.5 g/dL (ref 3.6–5.1)
ALT: 29 U/L (ref 9–46)
AST: 20 U/L (ref 10–35)
Alkaline phosphatase (APISO): 42 U/L (ref 40–115)
BILIRUBIN TOTAL: 0.9 mg/dL (ref 0.2–1.2)
BUN: 12 mg/dL (ref 7–25)
CALCIUM: 9.5 mg/dL (ref 8.6–10.3)
CHLORIDE: 102 mmol/L (ref 98–110)
CO2: 30 mmol/L (ref 20–32)
Creat: 0.94 mg/dL (ref 0.70–1.33)
GFR, EST AFRICAN AMERICAN: 109 mL/min/{1.73_m2} (ref >=60)
GFR, EST NON AFRICAN AMERICAN: 94 mL/min/{1.73_m2} (ref >=60)
Globulin: 2.8 g/dL (calc) (ref 1.9–3.7)
Glucose, Bld: 102 mg/dL — ABNORMAL HIGH (ref 65–99)
POTASSIUM: 4.2 mmol/L (ref 3.5–5.3)
Sodium: 138 mmol/L (ref 135–146)
TOTAL PROTEIN: 7.3 g/dL (ref 6.1–8.1)

## 2017-06-09 LAB — TESTOSTERONE: Testosterone: 438 ng/dL (ref 250–827)

## 2017-06-09 LAB — LIPID PANEL
CHOL/HDL RATIO: 3.1 (calc) (ref <5.0)
CHOLESTEROL: 165 mg/dL (ref <200)
HDL: 53 mg/dL (ref >40)
LDL CHOLESTEROL (CALC): 91 mg/dL
NON-HDL CHOLESTEROL (CALC): 112 mg/dL (ref <130)
TRIGLYCERIDES: 113 mg/dL (ref <150)

## 2017-06-09 LAB — IRON: Iron: 170 ug/dL (ref 50–180)

## 2017-06-09 LAB — TSH: TSH: 1.35 mIU/L (ref 0.40–4.50)

## 2017-06-09 LAB — PSA: PSA: 0.8 ng/mL (ref <=4.0)

## 2017-06-13 ENCOUNTER — Telehealth: Payer: Self-pay

## 2017-06-13 NOTE — Telephone Encounter (Signed)
Patient advised as directed below.  Thanks,  -Joseline 

## 2017-06-13 NOTE — Telephone Encounter (Signed)
-----   Message from Maple Hudsonichard L Gilbert Jr., MD sent at 06/13/2017  3:55 PM EST ----- Labs OK

## 2017-08-21 ENCOUNTER — Ambulatory Visit: Payer: BLUE CROSS/BLUE SHIELD | Admitting: Family Medicine

## 2017-09-06 DIAGNOSIS — Z1211 Encounter for screening for malignant neoplasm of colon: Secondary | ICD-10-CM | POA: Diagnosis not present

## 2017-09-06 DIAGNOSIS — Z01818 Encounter for other preprocedural examination: Secondary | ICD-10-CM | POA: Diagnosis not present

## 2017-09-06 DIAGNOSIS — Z8371 Family history of colonic polyps: Secondary | ICD-10-CM | POA: Diagnosis not present

## 2017-11-09 DIAGNOSIS — Z1211 Encounter for screening for malignant neoplasm of colon: Secondary | ICD-10-CM | POA: Diagnosis not present

## 2017-11-09 DIAGNOSIS — K635 Polyp of colon: Secondary | ICD-10-CM | POA: Diagnosis not present

## 2017-11-09 DIAGNOSIS — D126 Benign neoplasm of colon, unspecified: Secondary | ICD-10-CM | POA: Diagnosis not present

## 2017-11-09 DIAGNOSIS — Z8371 Family history of colonic polyps: Secondary | ICD-10-CM | POA: Diagnosis not present

## 2017-11-09 DIAGNOSIS — K640 First degree hemorrhoids: Secondary | ICD-10-CM | POA: Diagnosis not present

## 2017-11-09 DIAGNOSIS — K648 Other hemorrhoids: Secondary | ICD-10-CM | POA: Diagnosis not present

## 2017-11-20 ENCOUNTER — Other Ambulatory Visit: Payer: Self-pay | Admitting: Family Medicine

## 2017-11-20 NOTE — Telephone Encounter (Signed)
Pt needs refill on his alprazolam 0.5 mg  Needs new rx  [facility] [address]  Thanks teri

## 2017-11-22 MED ORDER — ALPRAZOLAM 0.5 MG PO TABS: 0.5000 mg | ORAL_TABLET | Freq: Every evening | ORAL | 2 refills | Status: DC | PRN

## 2017-12-22 DIAGNOSIS — D2261 Melanocytic nevi of right upper limb, including shoulder: Secondary | ICD-10-CM | POA: Diagnosis not present

## 2017-12-22 DIAGNOSIS — D225 Melanocytic nevi of trunk: Secondary | ICD-10-CM | POA: Diagnosis not present

## 2017-12-22 DIAGNOSIS — D2262 Melanocytic nevi of left upper limb, including shoulder: Secondary | ICD-10-CM | POA: Diagnosis not present

## 2017-12-22 DIAGNOSIS — D2272 Melanocytic nevi of left lower limb, including hip: Secondary | ICD-10-CM | POA: Diagnosis not present

## 2018-06-13 ENCOUNTER — Other Ambulatory Visit: Payer: Self-pay

## 2018-06-13 ENCOUNTER — Encounter: Payer: Self-pay | Admitting: Family Medicine

## 2018-06-13 ENCOUNTER — Ambulatory Visit: Payer: BLUE CROSS/BLUE SHIELD | Admitting: Family Medicine

## 2018-06-13 VITALS — BP 132/100 | HR 74 | Temp 97.5°F | Ht 72.0 in | Wt 228.2 lb

## 2018-06-13 DIAGNOSIS — N410 Acute prostatitis: Secondary | ICD-10-CM | POA: Diagnosis not present

## 2018-06-13 MED ORDER — DOXYCYCLINE HYCLATE 100 MG PO TABS: 100.0000 mg | ORAL_TABLET | Freq: Two times a day (BID) | ORAL | 0 refills | Status: DC

## 2018-06-13 NOTE — Progress Notes (Signed)
Patient: Shawn BeachRichard N Mcweeney Male    DOB: 10/06/1965   52 y.o.   MRN: [id number] Visit Date: 06/13/2018  Today's Provider: Megan Mansichard Gilbert Jr, MD   Chief Complaint  Patient presents with  . possible prostate infection   Subjective:    HPI  Pt reports he is having pain in left lower abdomen below waist line and thinks it started over a month ago 05/13/18.  He states he has had past prostate infections and he feels that is what is going on. Urinary frequency and urgency.    No Known Allergies   Current Outpatient Medications:  .  cetirizine (ZYRTEC ALLERGY) 10 MG tablet, Take by mouth., Disp: , Rfl:  .  fluticasone (FLONASE) 50 MCG/ACT nasal spray, 1-2 SPRAYS IN EACH NOSTRIL DAILY AS NEEDED, Disp: 16 g, Rfl: 8 .  omeprazole (PRILOSEC) 20 MG capsule, Take 1 capsule (20 mg total) every morning by mouth., Disp: 90 capsule, Rfl: 3 .  tamsulosin (FLOMAX) 0.4 MG CAPS capsule, TAKE 1 CAPSULE (0.4 MG TOTAL) BY MOUTH DAILY., Disp: 90 capsule, Rfl: 3 .  ALPRAZolam (XANAX) 0.5 MG tablet, Take 1 tablet (0.5 mg total) by mouth at bedtime as needed for anxiety. (Patient not taking: Reported on 06/13/2018), Disp: 30 tablet, Rfl: 2 .  ranitidine (ZANTAC) 150 MG capsule, Take 1 capsule (150 mg total) by mouth 2 (two) times daily. (Patient not taking: Reported on 06/13/2018), Disp: 180 capsule, Rfl: 3  Review of Systems  Constitutional: Negative.   HENT: Negative.   Eyes: Negative.   Respiratory: Negative.   Cardiovascular: Negative.   Gastrointestinal: Negative.   Endocrine: Negative.   Genitourinary: Positive for difficulty urinating and urgency (during the night last week was worse). Negative for decreased urine volume, discharge, dysuria, enuresis, flank pain, frequency, genital sores, hematuria, penile pain, penile swelling, scrotal swelling and testicular pain.  Musculoskeletal: Negative.   Skin: Negative.   Allergic/Immunologic: Negative.   Neurological: Negative.   Hematological:  Negative.   Psychiatric/Behavioral: Negative.     Social History   Tobacco Use  . Smoking status: Never Smoker  . Smokeless tobacco: Never Used  Substance Use Topics  . Alcohol use: Yes    Comment: occasionally   Objective:   BP (!) 132/100 (BP Location: Right Arm, Patient Position: Sitting, Cuff Size: Normal)   Pulse 74   Temp (!) 97.5 F (36.4 C) (Oral)   Ht 6' (1.829 m)   Wt 228 lb 3.2 oz (103.5 kg)   SpO2 98%   BMI 30.95 kg/m  Vitals:   06/13/18 1528  BP: (!) 132/100  Pulse: 74  Temp: (!) 97.5 F (36.4 C)  TempSrc: Oral  SpO2: 98%  Weight: 228 lb 3.2 oz (103.5 kg)  Height: 6' (1.829 m)     Physical Exam  Constitutional: He is oriented to person, place, and time. He appears well-developed and well-nourished.  HENT:  Head: Normocephalic and atraumatic.  Right Ear: External ear normal.  Left Ear: External ear normal.  Nose: Nose normal.  Eyes: Conjunctivae are normal. No scleral icterus.  Neck: No thyromegaly present.  Cardiovascular: Normal rate, regular rhythm and normal heart sounds.  Pulmonary/Chest: Effort normal and breath sounds normal.  Abdominal: Soft.  Musculoskeletal: He exhibits no edema.  Neurological: He is alert and oriented to person, place, and time.  Skin: Skin is warm and dry.  Psychiatric: He has a normal mood and affect. His behavior is normal. Judgment and thought content normal.  Assessment & Plan:     1. Acute prostatitis RTC 1-2 months. - doxycycline (VIBRA-TABS) 100 MG tablet; Take 1 tablet (100 mg total) by mouth 2 (two) times daily.  Dispense: 20 tablet; Refill: 0     I have done the exam and reviewed the chart and it is accurate to the best of my knowledge. Dentist has been used and  any errors in dictation or transcription are unintentional. Julieanne Manson M.D. [facility]   Megan Mans, MD  [facility]

## 2018-06-17 ENCOUNTER — Other Ambulatory Visit: Payer: Self-pay | Admitting: Family Medicine

## 2018-06-19 ENCOUNTER — Other Ambulatory Visit: Payer: Self-pay | Admitting: Family Medicine

## 2018-06-20 ENCOUNTER — Other Ambulatory Visit: Payer: Self-pay | Admitting: Family Medicine

## 2018-07-23 ENCOUNTER — Ambulatory Visit (INDEPENDENT_AMBULATORY_CARE_PROVIDER_SITE_OTHER): Payer: BLUE CROSS/BLUE SHIELD | Admitting: Family Medicine

## 2018-07-23 ENCOUNTER — Encounter: Payer: Self-pay | Admitting: Family Medicine

## 2018-07-23 VITALS — BP 124/83 | HR 69 | Temp 97.5°F | Resp 16 | Ht 72.0 in | Wt 226.6 lb

## 2018-07-23 DIAGNOSIS — M5432 Sciatica, left side: Secondary | ICD-10-CM

## 2018-07-23 DIAGNOSIS — E6609 Other obesity due to excess calories: Secondary | ICD-10-CM

## 2018-07-23 DIAGNOSIS — Z0000 Encounter for general adult medical examination without abnormal findings: Secondary | ICD-10-CM

## 2018-07-23 DIAGNOSIS — R739 Hyperglycemia, unspecified: Secondary | ICD-10-CM

## 2018-07-23 DIAGNOSIS — Z6830 Body mass index (BMI) 30.0-30.9, adult: Secondary | ICD-10-CM

## 2018-07-23 DIAGNOSIS — Z125 Encounter for screening for malignant neoplasm of prostate: Secondary | ICD-10-CM | POA: Diagnosis not present

## 2018-07-23 DIAGNOSIS — Z13220 Encounter for screening for lipoid disorders: Secondary | ICD-10-CM

## 2018-07-23 LAB — POCT URINALYSIS DIPSTICK
BILIRUBIN UA: NEGATIVE
Glucose, UA: NEGATIVE
Ketones, UA: NEGATIVE
LEUKOCYTES UA: NEGATIVE
NITRITE UA: NEGATIVE
PH UA: 7 (ref 5.0–8.0)
PROTEIN UA: NEGATIVE
RBC UA: NEGATIVE
Spec Grav, UA: <=1.005 — AB (ref 1.010–1.025)
UROBILINOGEN UA: 0.2 U/dL

## 2018-07-23 MED ORDER — NAPROXEN 500 MG PO TABS: 500.0000 mg | ORAL_TABLET | Freq: Two times a day (BID) | ORAL | 3 refills | Status: DC | PRN

## 2018-07-23 MED ORDER — ALPRAZOLAM 0.5 MG PO TABS: 0.5000 mg | ORAL_TABLET | Freq: Every evening | ORAL | 2 refills | Status: AC | PRN

## 2018-07-23 MED ORDER — ALPRAZOLAM 0.5 MG PO TABS: 0.5000 mg | ORAL_TABLET | Freq: Every evening | ORAL | 2 refills | Status: DC | PRN

## 2018-07-23 MED ORDER — CYCLOBENZAPRINE HCL 10 MG PO TABS: 10.0000 mg | ORAL_TABLET | Freq: Every day | ORAL | 0 refills | Status: DC

## 2018-07-23 NOTE — Progress Notes (Signed)
Patient: Shawn Vasquez, Male    DOB: 11-Sep-1965, 52 y.o.   MRN: [id number] Visit Date: 07/23/2018  Today's Provider: Megan Mans, MD   Chief Complaint  Patient presents with  . Annual Exam   Subjective:    Annual physical exam Shawn Vasquez is a 52 y.o. male who presents today for health maintenance and complete physical. He feels well. He reports he is not actively exercising, but has been staying busy with work. He reports he is sleeping well averaging 6-8hrs a night. Patient has declined receiving flu vaccine today.   -----------------------------------------------------------------   Review of Systems  Constitutional: Negative.   HENT: Positive for congestion.   Eyes: Negative.   Respiratory: Negative.   Cardiovascular: Negative.   Gastrointestinal: Negative.        Chronic heartburn.  Endocrine: Negative.   Genitourinary: Negative.   Musculoskeletal: Negative.   Skin: Negative.   Allergic/Immunologic: Negative.   Neurological: Negative.   Hematological: Negative.   Psychiatric/Behavioral: Negative.   All other systems reviewed and are negative.   Social History He  reports that he has never smoked. He has never used smokeless tobacco. He reports current alcohol use. He reports that he does not use drugs. Social History   Socioeconomic History  . Marital status: Married    Spouse name: Not on file  . Number of children: Not on file  . Years of education: Not on file  . Highest education level: Not on file  Occupational History  . Not on file  Social Needs  . Financial resource strain: Not on file  . Food insecurity:    Worry: Not on file    Inability: Not on file  . Transportation needs:    Medical: Not on file    Non-medical: Not on file  Tobacco Use  . Smoking status: Never Smoker  . Smokeless tobacco: Never Used  Substance and Sexual Activity  . Alcohol use: Yes    Comment: occasionally  . Drug use: No  . Sexual activity: Yes    Lifestyle  . Physical activity:    Days per week: Not on file    Minutes per session: Not on file  . Stress: Not on file  Relationships  . Social connections:    Talks on phone: Not on file    Gets together: Not on file    Attends religious service: Not on file    Active member of club or organization: Not on file    Attends meetings of clubs or organizations: Not on file    Relationship status: Not on file  Other Topics Concern  . Not on file  Social History Narrative  . Not on file    Patient Active Problem List   Diagnosis Date Noted  . Adaptation reaction 05/27/2015  . Benign fibroma of prostate 05/27/2015  . Gastro-esophageal reflux disease without esophagitis 05/27/2015  . Adaptive colitis 05/27/2015  . Cannot sleep 05/27/2015  . Allergic rhinitis 05/27/2015  . Benign prostatic hyperplasia with urinary obstruction 12/27/2012    Past Surgical History:  Procedure Laterality Date  . ACHILLES TENDON REPAIR    . HERNIA REPAIR     inguinal and umbilical  . OTHER SURGICAL HISTORY     bronchial cleft cyst removed  . TONSILLECTOMY AND ADENOIDECTOMY      Family History  Family Status  Relation Name Status  . Mother  Alive  . Father  Deceased  . Sister  Alive  .  Mat Uncle  (Not Specified)  . MGM  (Not Specified)  . PGM  (Not Specified)  . PGF  (Not Specified)   His family history includes Colon cancer in his paternal grandmother; Dementia in his maternal grandmother; Heart attack in his father; Lung cancer in his father; Prostate cancer in his maternal uncle; Seizures in his father; Skin cancer in his paternal grandfather.     No Known Allergies  Previous Medications   ALPRAZOLAM (XANAX) 0.5 MG TABLET    Take 1 tablet (0.5 mg total) by mouth at bedtime as needed for anxiety.   CETIRIZINE (ZYRTEC ALLERGY) 10 MG TABLET    Take by mouth.   FLUTICASONE (FLONASE) 50 MCG/ACT NASAL SPRAY    1-2 SPRAYS IN EACH NOSTRIL DAILY AS NEEDED   OMEPRAZOLE (PRILOSEC) 20 MG  CAPSULE    TAKE 1 CAPSULE (20 MG TOTAL) EVERY MORNING BY MOUTH.   TAMSULOSIN (FLOMAX) 0.4 MG CAPS CAPSULE    TAKE 1 CAPSULE (0.4 MG TOTAL) BY MOUTH DAILY.    Patient Care Team: Maple HudsonGilbert, Keena L Jr., MD as PCP - General (Family Medicine)      Objective:   Vitals: BP 124/83   Pulse 69   Temp (!) 97.5 F (36.4 C) (Oral)   Resp 16   Ht 6' (1.829 m)   Wt 226 lb 9.6 oz (102.8 kg)   BMI 30.73 kg/m    Physical Exam Constitutional:      Appearance: Normal appearance. He is well-developed. He is obese.  HENT:     Head: Normocephalic and atraumatic.     Right Ear: Tympanic membrane and external ear normal.     Left Ear: Tympanic membrane and external ear normal.     Mouth/Throat:     Pharynx: Oropharynx is clear.  Eyes:     General: No scleral icterus.    Conjunctiva/sclera: Conjunctivae normal.     Pupils: Pupils are equal, round, and reactive to light.  Neck:     Musculoskeletal: Normal range of motion and neck supple.  Cardiovascular:     Rate and Rhythm: Normal rate and regular rhythm.     Heart sounds: Normal heart sounds. No murmur. No gallop.   Pulmonary:     Effort: Pulmonary effort is normal. No respiratory distress.     Breath sounds: Normal breath sounds. No wheezing.  Abdominal:     General: There is no distension.     Palpations: Abdomen is soft.     Tenderness: There is no abdominal tenderness.  Genitourinary:    Penis: Normal.   Musculoskeletal:        General: No swelling or tenderness.  Skin:    General: Skin is warm and dry.     Findings: No erythema or rash.  Neurological:     General: No focal deficit present.     Mental Status: He is alert and oriented to person, place, and time. Mental status is at baseline.     Cranial Nerves: No cranial nerve deficit.     Coordination: Coordination normal.  Psychiatric:        Mood and Affect: Mood normal.        Behavior: Behavior normal.        Thought Content: Thought content normal.        Judgment:  Judgment normal.      Depression Screen PHQ 2/9 Scores 07/23/2018 06/13/2018 06/08/2017 06/07/2016  PHQ - 2 Score 0 0 0 0  PHQ- 9 Score 0 - 0 -  Assessment & Plan:     Routine Health Maintenance and Physical Exam  Exercise Activities and Dietary recommendations Goals   None     Immunization History  Administered Date(s) Administered  . Tdap 05/30/2014    Health Maintenance  Topic Date Due  . HIV Screening  08/03/1981  . COLONOSCOPY  08/03/2016  . INFLUENZA VACCINE  06/13/2024 (Originally 03/01/2018)  . TETANUS/TDAP  05/30/2024     Discussed health benefits of physical activity, and encouraged him to engage in regular exercise appropriate for his age and condition.  1. Annual physical exam Lifestyle choices are discussed for diet and exercise. - CBC with Differential/Platelet - TSH - POCT urinalysis dipstick  2. Prostate cancer screening  - PSA  3. Hyperglycemia  - Comprehensive metabolic panel - Hemoglobin A1c  4. Screening cholesterol level  - Lipid panel  5. Left sided sciatica May Need further imaging or referral. - cyclobenzaprine (FLEXERIL) 10 MG tablet; Take 1 tablet (10 mg total) by mouth at bedtime.  Dispense: 30 tablet; Refill: 0 - naproxen (NAPROSYN) 500 MG tablet; Take 1 tablet (500 mg total) by mouth 2 (two) times daily as needed.  Dispense: 60 tablet; Refill: 3  6. Class 1 obesity due to excess calories without serious comorbidity with body mass index (BMI) of 30.0 to 30.9 in adult Dietary changes discussed at some length.    -------------------------------------------------------------------- I have done the exam and reviewed the chart and it is accurate to the best of my knowledge. DentistDragon  technology has been used and  any errors in dictation or transcription are unintentional. Julieanne Mansonichard Gilbert M.D. [facility]

## 2018-07-24 LAB — CBC WITH DIFFERENTIAL/PLATELET
BASOS ABS: 0 10*3/uL (ref 0.0–0.2)
Basos: 1 %
EOS (ABSOLUTE): 0.3 10*3/uL (ref 0.0–0.4)
Eos: 4 %
HEMOGLOBIN: 17.3 g/dL (ref 13.0–17.7)
Hematocrit: 49 % (ref 37.5–51.0)
IMMATURE GRANS (ABS): 0 10*3/uL (ref 0.0–0.1)
IMMATURE GRANULOCYTES: 0 %
LYMPHS: 21 %
Lymphocytes Absolute: 1.5 10*3/uL (ref 0.7–3.1)
MCH: 31.4 pg (ref 26.6–33.0)
MCHC: 35.3 g/dL (ref 31.5–35.7)
MCV: 89 fL (ref 79–97)
MONOCYTES: 7 %
Monocytes Absolute: 0.5 10*3/uL (ref 0.1–0.9)
NEUTROS ABS: 4.8 10*3/uL (ref 1.4–7.0)
Neutrophils: 67 %
Platelets: 210 10*3/uL (ref 150–450)
RBC: 5.51 x10E6/uL (ref 4.14–5.80)
RDW: 12 % — ABNORMAL LOW (ref 12.3–15.4)
WBC: 7 10*3/uL (ref 3.4–10.8)

## 2018-07-24 LAB — HEMOGLOBIN A1C
Est. average glucose Bld gHb Est-mCnc: 108 mg/dL
Hgb A1c MFr Bld: 5.4 % (ref 4.8–5.6)

## 2018-07-24 LAB — COMPREHENSIVE METABOLIC PANEL
ALBUMIN: 4.4 g/dL (ref 3.5–5.5)
ALT: 39 IU/L (ref 0–44)
AST: 24 IU/L (ref 0–40)
Albumin/Globulin Ratio: 1.7 (ref 1.2–2.2)
Alkaline Phosphatase: 44 IU/L (ref 39–117)
BILIRUBIN TOTAL: 0.7 mg/dL (ref 0.0–1.2)
BUN / CREAT RATIO: 9 (ref 9–20)
BUN: 10 mg/dL (ref 6–24)
CALCIUM: 9.7 mg/dL (ref 8.7–10.2)
CHLORIDE: 98 mmol/L (ref 96–106)
CO2: 26 mmol/L (ref 20–29)
CREATININE: 1.06 mg/dL (ref 0.76–1.27)
GFR, EST AFRICAN AMERICAN: 93 mL/min/{1.73_m2} (ref >59)
GFR, EST NON AFRICAN AMERICAN: 81 mL/min/{1.73_m2} (ref >59)
GLUCOSE: 99 mg/dL (ref 65–99)
Globulin, Total: 2.6 g/dL (ref 1.5–4.5)
Potassium: 4.7 mmol/L (ref 3.5–5.2)
Sodium: 138 mmol/L (ref 134–144)
TOTAL PROTEIN: 7 g/dL (ref 6.0–8.5)

## 2018-07-24 LAB — LIPID PANEL
CHOL/HDL RATIO: 3.2 ratio (ref 0.0–5.0)
CHOLESTEROL TOTAL: 163 mg/dL (ref 100–199)
HDL: 51 mg/dL (ref >39)
LDL CALC: 90 mg/dL (ref 0–99)
TRIGLYCERIDES: 108 mg/dL (ref 0–149)
VLDL CHOLESTEROL CAL: 22 mg/dL (ref 5–40)

## 2018-07-24 LAB — PSA: Prostate Specific Ag, Serum: 0.7 ng/mL (ref 0.0–4.0)

## 2018-07-24 LAB — TSH: TSH: 1.22 u[IU]/mL (ref 0.450–4.500)

## 2018-09-25 ENCOUNTER — Other Ambulatory Visit: Payer: Self-pay | Admitting: Family Medicine

## 2018-09-25 NOTE — Telephone Encounter (Signed)
tamsulosin (FLOMAX) 0.4 MG CAPS capsule omeprazole (PRILOSEC) 20 MG capsule  Needing refills.  Pt's insurance will only pay for the above medications if they are moved to [facility] and for a 90 day supply.  Please call pt to let him know if he needs to go by 1st to [facility] to give his insurance info or if he gives it at the time of picking up Rx's.  [facility] [address] [place], [place] [address]  [phone]   Please advise.  Thanks, [employer]

## 2018-09-26 MED ORDER — OMEPRAZOLE 20 MG PO CPDR: 20.0000 mg | DELAYED_RELEASE_CAPSULE | Freq: Every morning | ORAL | 3 refills | Status: DC

## 2018-09-26 MED ORDER — TAMSULOSIN HCL 0.4 MG PO CAPS: 0.4000 mg | ORAL_CAPSULE | Freq: Every day | ORAL | 3 refills | Status: DC

## 2018-09-26 NOTE — Telephone Encounter (Signed)
Ok to send in 90 day supply 

## 2018-10-22 ENCOUNTER — Ambulatory Visit: Payer: Self-pay | Admitting: Family Medicine

## 2018-12-21 DIAGNOSIS — L570 Actinic keratosis: Secondary | ICD-10-CM | POA: Diagnosis not present

## 2018-12-21 DIAGNOSIS — D225 Melanocytic nevi of trunk: Secondary | ICD-10-CM | POA: Diagnosis not present

## 2018-12-21 DIAGNOSIS — X32XXXA Exposure to sunlight, initial encounter: Secondary | ICD-10-CM | POA: Diagnosis not present

## 2018-12-21 DIAGNOSIS — D2262 Melanocytic nevi of left upper limb, including shoulder: Secondary | ICD-10-CM | POA: Diagnosis not present

## 2018-12-21 DIAGNOSIS — D2261 Melanocytic nevi of right upper limb, including shoulder: Secondary | ICD-10-CM | POA: Diagnosis not present

## 2018-12-21 DIAGNOSIS — D2272 Melanocytic nevi of left lower limb, including hip: Secondary | ICD-10-CM | POA: Diagnosis not present

## 2018-12-25 DIAGNOSIS — M9903 Segmental and somatic dysfunction of lumbar region: Secondary | ICD-10-CM | POA: Diagnosis not present

## 2018-12-25 DIAGNOSIS — M5387 Other specified dorsopathies, lumbosacral region: Secondary | ICD-10-CM | POA: Diagnosis not present

## 2018-12-25 DIAGNOSIS — M5442 Lumbago with sciatica, left side: Secondary | ICD-10-CM | POA: Diagnosis not present

## 2018-12-28 DIAGNOSIS — M9903 Segmental and somatic dysfunction of lumbar region: Secondary | ICD-10-CM | POA: Diagnosis not present

## 2018-12-28 DIAGNOSIS — M5387 Other specified dorsopathies, lumbosacral region: Secondary | ICD-10-CM | POA: Diagnosis not present

## 2018-12-28 DIAGNOSIS — M5442 Lumbago with sciatica, left side: Secondary | ICD-10-CM | POA: Diagnosis not present

## 2018-12-28 DIAGNOSIS — M531 Cervicobrachial syndrome: Secondary | ICD-10-CM | POA: Diagnosis not present

## 2019-05-06 ENCOUNTER — Other Ambulatory Visit: Payer: Self-pay

## 2019-05-06 ENCOUNTER — Ambulatory Visit (INDEPENDENT_AMBULATORY_CARE_PROVIDER_SITE_OTHER): Payer: BC Managed Care – PPO | Admitting: Family Medicine

## 2019-05-06 DIAGNOSIS — J301 Allergic rhinitis due to pollen: Secondary | ICD-10-CM

## 2019-05-06 DIAGNOSIS — J069 Acute upper respiratory infection, unspecified: Secondary | ICD-10-CM

## 2019-05-06 DIAGNOSIS — R059 Cough, unspecified: Secondary | ICD-10-CM

## 2019-05-06 DIAGNOSIS — R05 Cough: Secondary | ICD-10-CM

## 2019-05-06 MED ORDER — FLUTICASONE PROPIONATE 50 MCG/ACT NA SUSP: NASAL | 3 refills | Status: DC

## 2019-05-06 NOTE — Progress Notes (Signed)
       Patient: Shawn Vasquez Male    DOB: Sep 12, 1965   53 y.o.   MRN: [id number] Visit Date: 05/06/2019  Today's Provider: [name], MD   No chief complaint on file.  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  3 days ago he developed sinus congestion and postnasal drainage with a morning cough.  This resolved by late morning and is helped by Mucinex.  No fever or myalgias.  No no COVID exposure.  He does have a wife who has rheumatoid arthritis. HPI  No Known Allergies   Current Outpatient Medications:  .  ALPRAZolam (XANAX) 0.5 MG tablet, Take 1 tablet (0.5 mg total) by mouth at bedtime as needed for anxiety., Disp: 30 tablet, Rfl: 2 .  cetirizine (ZYRTEC ALLERGY) 10 MG tablet, Take by mouth., Disp: , Rfl:  .  cyclobenzaprine (FLEXERIL) 10 MG tablet, Take 1 tablet (10 mg total) by mouth at bedtime., Disp: 30 tablet, Rfl: 0 .  fluticasone (FLONASE) 50 MCG/ACT nasal spray, 1-2 SPRAYS IN EACH NOSTRIL DAILY AS NEEDED, Disp: 48 g, Rfl: 3 .  naproxen (NAPROSYN) 500 MG tablet, Take 1 tablet (500 mg total) by mouth 2 (two) times daily as needed., Disp: 60 tablet, Rfl: 3 .  omeprazole (PRILOSEC) 20 MG capsule, Take 1 capsule (20 mg total) by mouth every morning., Disp: 90 capsule, Rfl: 3 .  tamsulosin (FLOMAX) 0.4 MG CAPS capsule, Take 1 capsule (0.4 mg total) by mouth daily., Disp: 90 capsule, Rfl: 3  Review of Systems  HENT: Positive for postnasal drip.   Respiratory: Positive for cough.     Social History   Tobacco Use  . Smoking status: Never Smoker  . Smokeless tobacco: Never Used  Substance Use Topics  . Alcohol use: Yes    Comment: occasionally      Objective:   There were no vitals taken for this visit. There were no vitals filed for this visit.There is no height or weight on file to calculate BMI.   Physical Exam   No results found for any visits on 05/06/19.     Assessment & Plan     1. Cough Check COVID test.  Use Mucinex daily and push fluids. - Novel Coronavirus, NAA ([facility])  2. Viral upper respiratory tract infection Fluids and rest.  3. Allergic rhinitis due to pollen, unspecified seasonality Try Flonase nasal spray.  Follow-up PRN.   10 minute phone call.  Jeshawn Cranford Mon, MD  [facility]

## 2019-05-07 ENCOUNTER — Other Ambulatory Visit: Payer: Self-pay

## 2019-05-07 DIAGNOSIS — Z20822 Contact with and (suspected) exposure to covid-19: Secondary | ICD-10-CM

## 2019-05-07 DIAGNOSIS — Z20828 Contact with and (suspected) exposure to other viral communicable diseases: Secondary | ICD-10-CM | POA: Diagnosis not present

## 2019-05-10 ENCOUNTER — Telehealth: Payer: Self-pay

## 2019-05-10 LAB — NOVEL CORONAVIRUS, NAA: SARS-CoV-2, NAA: NOT DETECTED

## 2019-05-10 NOTE — Telephone Encounter (Signed)
Pt advised.   Thanks,   -Kacee Koren  

## 2019-05-10 NOTE — Telephone Encounter (Signed)
-----   Message from Jerrol Banana., MD sent at 05/10/2019  8:23 AM EDT ----- Negative covid.

## 2019-10-29 ENCOUNTER — Other Ambulatory Visit: Payer: Self-pay | Admitting: Family Medicine

## 2019-10-29 NOTE — Telephone Encounter (Signed)
Requested Prescriptions  Pending Prescriptions Disp Refills  . omeprazole (PRILOSEC) 20 MG capsule [Pharmacy Med Name: OMEPRAZOLE 20MG  CAPSULES] 90 capsule 3    Sig: TAKE 1 CAPSULE(20 MG) BY MOUTH EVERY MORNING     Gastroenterology: Proton Pump Inhibitors Passed - 10/29/2019  3:43 AM      Passed - Valid encounter within last 12 months    Recent Outpatient Visits          5 months ago Cough   [facility] [place]   1 year ago Annual physical exam   [facility] [place]   1 year ago Acute prostatitis   [facility] [place]   2 years ago Annual physical exam   [facility] [place]   3 years ago Cough   [facility] [place]

## 2019-12-20 DIAGNOSIS — D2261 Melanocytic nevi of right upper limb, including shoulder: Secondary | ICD-10-CM | POA: Diagnosis not present

## 2019-12-20 DIAGNOSIS — D2271 Melanocytic nevi of right lower limb, including hip: Secondary | ICD-10-CM | POA: Diagnosis not present

## 2019-12-20 DIAGNOSIS — D2262 Melanocytic nevi of left upper limb, including shoulder: Secondary | ICD-10-CM | POA: Diagnosis not present

## 2019-12-20 DIAGNOSIS — D225 Melanocytic nevi of trunk: Secondary | ICD-10-CM | POA: Diagnosis not present

## 2019-12-25 ENCOUNTER — Other Ambulatory Visit: Payer: Self-pay

## 2019-12-25 ENCOUNTER — Ambulatory Visit (INDEPENDENT_AMBULATORY_CARE_PROVIDER_SITE_OTHER): Payer: BC Managed Care – PPO | Admitting: Family Medicine

## 2019-12-25 ENCOUNTER — Encounter: Payer: Self-pay | Admitting: Family Medicine

## 2019-12-25 VITALS — BP 138/86 | HR 75 | Temp 97.3°F | Resp 16 | Ht 72.0 in | Wt 222.0 lb

## 2019-12-25 DIAGNOSIS — Z0000 Encounter for general adult medical examination without abnormal findings: Secondary | ICD-10-CM | POA: Diagnosis not present

## 2019-12-25 DIAGNOSIS — J302 Other seasonal allergic rhinitis: Secondary | ICD-10-CM | POA: Diagnosis not present

## 2019-12-25 DIAGNOSIS — R739 Hyperglycemia, unspecified: Secondary | ICD-10-CM

## 2019-12-25 DIAGNOSIS — Z6830 Body mass index (BMI) 30.0-30.9, adult: Secondary | ICD-10-CM

## 2019-12-25 DIAGNOSIS — K219 Gastro-esophageal reflux disease without esophagitis: Secondary | ICD-10-CM

## 2019-12-25 DIAGNOSIS — Z125 Encounter for screening for malignant neoplasm of prostate: Secondary | ICD-10-CM

## 2019-12-25 DIAGNOSIS — E6609 Other obesity due to excess calories: Secondary | ICD-10-CM

## 2019-12-25 LAB — POCT URINALYSIS DIPSTICK
Appearance: NORMAL
Bilirubin, UA: NEGATIVE
Glucose, UA: NEGATIVE
Ketones, UA: NEGATIVE
Leukocytes, UA: NEGATIVE
Nitrite, UA: NEGATIVE
Odor: NORMAL
Protein, UA: NEGATIVE
Spec Grav, UA: 1.02 (ref 1.010–1.025)
Urobilinogen, UA: 0.2 E.U./dL
pH, UA: 6 (ref 5.0–8.0)

## 2019-12-25 NOTE — Progress Notes (Signed)
Complete physical exam  I,Shawn Vasquez,acting as a scribe for Shawn Durie, MD.,have documented all relevant documentation on the behalf of Shawn Echeverri, MD,as directed by  Shawn Durie, MD while in the presence of Shawn Durie, MD.   Patient: Shawn Vasquez   DOB: [birth year]   54 y.o. Male  MRN: [id number] Visit Date: 12/25/2019  Today's healthcare provider: [name], MD   Chief Complaint  Patient presents with  . Annual Exam   Subjective    Shawn Vasquez is a 54 y.o. male who presents today for a complete physical exam.  He reports consuming a general diet. Home exercise routine includes some. He generally feels well. He reports sleeping well. He does not have additional problems to discuss today.  He has not had his Covid shots and is not sure he is going to get them. HPI    Past Medical History:  Diagnosis Date  . Prostate infection 1990's   Past Surgical History:  Procedure Laterality Date  . ACHILLES TENDON REPAIR    . HERNIA REPAIR     inguinal and umbilical  . OTHER SURGICAL HISTORY     bronchial cleft cyst removed  . TONSILLECTOMY AND ADENOIDECTOMY     Social History   Socioeconomic History  . Marital status: Married    Spouse name: Not on file  . Number of children: Not on file  . Years of education: Not on file  . Highest education level: Not on file  Occupational History  . Not on file  Tobacco Use  . Smoking status: Never Smoker  . Smokeless tobacco: Never Used  Substance and Sexual Activity  . Alcohol use: Yes    Comment: occasionally  . Drug use: No  . Sexual activity: Yes  Other Topics Concern  . Not on file  Social History Narrative  . Not on file   Social Determinants of Health   Financial Resource Strain:   . Difficulty of Paying Living Expenses:   Food Insecurity:   . Worried About Charity fundraiser in the Last Year:   . Arboriculturist in the Last Year:   Transportation Needs:   . Lexicographer (Medical):   Marland Kitchen Lack of Transportation (Non-Medical):   Physical Activity:   . Days of Exercise per Week:   . Minutes of Exercise per Session:   Stress:   . Feeling of Stress :   Social Connections:   . Frequency of Communication with Friends and Family:   . Frequency of Social Gatherings with Friends and Family:   . Attends Religious Services:   . Active Member of Clubs or Organizations:   . Attends Archivist Meetings:   Marland Kitchen Marital Status:   Intimate Partner Violence:   . Fear of Current or Ex-Partner:   . Emotionally Abused:   Marland Kitchen Physically Abused:   . Sexually Abused:    Family Status  Relation Name Status  . Mother  Alive  . Father  Deceased  . Sister  Alive  . Mat Uncle  (Not Specified)  . MGM  (Not Specified)  . PGM  (Not Specified)  . PGF  (Not Specified)   Family History  Problem Relation Age of Onset  . Heart attack Father   . Seizures Father   . Lung cancer Father   . Prostate cancer Maternal Uncle   . Dementia Maternal Grandmother   . Colon cancer Paternal Grandmother   .  Skin cancer Paternal Grandfather    No Known Allergies  Patient Care Team: Maple Hudson., MD as PCP - General (Family Medicine)   Medications: Outpatient Medications Prior to Visit  Medication Sig  . ALPRAZolam (XANAX) 0.5 MG tablet Take 1 tablet (0.5 mg total) by mouth at bedtime as needed for anxiety.  . cetirizine (ZYRTEC ALLERGY) 10 MG tablet Take by mouth.  . cyclobenzaprine (FLEXERIL) 10 MG tablet Take 1 tablet (10 mg total) by mouth at bedtime.  . fluticasone (FLONASE) 50 MCG/ACT nasal spray 1-2 SPRAYS IN EACH NOSTRIL DAILY AS NEEDED  . naproxen (NAPROSYN) 500 MG tablet Take 1 tablet (500 mg total) by mouth 2 (two) times daily as needed.  Marland Kitchen omeprazole (PRILOSEC) 20 MG capsule TAKE 1 CAPSULE(20 MG) BY MOUTH EVERY MORNING  . tamsulosin (FLOMAX) 0.4 MG CAPS capsule Take 1 capsule (0.4 mg total) by mouth daily.   No facility-administered medications  prior to visit.    Review of Systems  Constitutional: Negative.   HENT: Positive for congestion.   Eyes: Negative.   Respiratory: Negative.   Cardiovascular: Negative.   Gastrointestinal: Negative.        Chronic heartburn.  Endocrine: Negative.   Genitourinary: Positive for difficulty urinating.       Mild chronic BPH symptoms.  Decreased flow and sometimes incomplete emptying.  Musculoskeletal: Negative.   Skin: Negative.        He does state over the last 15 years or so he has decreased hair on his legs and is losing his eyebrows.  He told the dermatologist about this and she did not comment at all about any problems.  Allergic/Immunologic: Negative.   Neurological: Negative.   Hematological: Negative.   Psychiatric/Behavioral: Negative.   All other systems reviewed and are negative.      Objective    BP 138/86 (BP Location: Left Arm, Patient Position: Sitting, Cuff Size: Large)   Pulse 75   Temp (!) 97.3 F (36.3 C) (Other (Comment))   Resp 16   Ht 6' (1.829 m)   Wt 222 lb (100.7 kg)   SpO2 96%   BMI 30.11 kg/m  BP Readings from Last 3 Encounters:  12/25/19 138/86  07/23/18 124/83  06/13/18 (!) 132/100   Wt Readings from Last 3 Encounters:  12/25/19 222 lb (100.7 kg)  07/23/18 226 lb 9.6 oz (102.8 kg)  06/13/18 228 lb 3.2 oz (103.5 kg)      Physical Exam Vitals and nursing note reviewed.  Constitutional:      Appearance: Normal appearance. He is normal weight.  HENT:     Right Ear: Tympanic membrane normal.     Left Ear: Tympanic membrane normal.     Nose: Nose normal.     Mouth/Throat:     Mouth: Mucous membranes are moist.  Eyes:     General: No scleral icterus.    Conjunctiva/sclera: Conjunctivae normal.  Cardiovascular:     Rate and Rhythm: Normal rate and regular rhythm.     Pulses: Normal pulses.     Heart sounds: Normal heart sounds.  Pulmonary:     Effort: Pulmonary effort is normal.     Breath sounds: Normal breath sounds.  Abdominal:       General: Bowel sounds are normal.     Palpations: Abdomen is soft.  Genitourinary:    Penis: Normal.      Testes: Normal.  Musculoskeletal:        General: Normal range of motion.  Cervical back: Normal range of motion and neck supple.  Skin:    General: Skin is warm.  Neurological:     Mental Status: He is alert.  Psychiatric:        Mood and Affect: Mood normal.        Behavior: Behavior normal.       Depression Screen  PHQ 2/9 Scores 12/25/2019 07/23/2018 06/13/2018  PHQ - 2 Score 0 0 0  PHQ- 9 Score 0 0 -    No results found for any visits on 12/25/19.  Assessment & Plan    Routine Health Maintenance and Physical Exam  Exercise Activities and Dietary recommendations Goals   None     Immunization History  Administered Date(s) Administered  . Tdap 05/30/2014    Health Maintenance  Topic Date Due  . COVID-19 Vaccine (1) Never done  . HIV Screening  Never done  . INFLUENZA VACCINE  06/13/2024 (Originally 03/01/2020)  . TETANUS/TDAP  05/30/2024  . COLONOSCOPY  10/18/2027    Discussed health benefits of physical activity, and encouraged him to engage in regular exercise appropriate for his age and condition.  1. Annual physical exam Return 1 year.  Colonoscopy was 2 years ago. - TSH - Lipid panel - CBC w/Diff/Platelet - Comprehensive Metabolic Panel (CMET) - POCT urinalysis dipstick  2. Gastro-esophageal reflux disease without esophagitis  - TSH - Lipid panel - CBC w/Diff/Platelet - Comprehensive Metabolic Panel (CMET)  3. Seasonal allergic rhinitis, unspecified trigger  - TSH - Lipid panel - CBC w/Diff/Platelet - Comprehensive Metabolic Panel (CMET)  4. Hyperglycemia  - TSH - Lipid panel - CBC w/Diff/Platelet - Comprehensive Metabolic Panel (CMET)  5. Class 1 obesity due to excess calories without serious comorbidity with body mass index (BMI) of 30.0 to 30.9 in adult Discussed lifestyle at risk with eating and aerobic exercise. -  TSH - Lipid panel - CBC w/Diff/Platelet - Comprehensive Metabolic Panel (CMET)  6. Prostate cancer screening  - PSA - TSH - Lipid panel - CBC w/Diff/Platelet - Comprehensive Metabolic Panel (CMET) 7.  BPH Patient to stop Flomax.  As long as he is not having significant symptoms I think this is fine.  Return in about 1 year (around 12/24/2020) for CPE.     I, Megan Mans, MD, have reviewed all documentation for this visit. The documentation on 12/29/19 for the exam, diagnosis, procedures, and orders are all accurate and complete.    Octavius Wendelyn Breslow, MD  [facility] [phone] (phone) [phone] (fax)  [facility]

## 2019-12-26 ENCOUNTER — Telehealth: Payer: Self-pay

## 2019-12-26 LAB — COMPREHENSIVE METABOLIC PANEL
ALT: 37 IU/L (ref 0–44)
AST: 25 IU/L (ref 0–40)
Albumin/Globulin Ratio: 1.9 (ref 1.2–2.2)
Albumin: 4.7 g/dL (ref 3.8–4.9)
Alkaline Phosphatase: 54 IU/L (ref 48–121)
BUN/Creatinine Ratio: 13 (ref 9–20)
BUN: 14 mg/dL (ref 6–24)
Bilirubin Total: 0.8 mg/dL (ref 0.0–1.2)
CO2: 22 mmol/L (ref 20–29)
Calcium: 9.7 mg/dL (ref 8.7–10.2)
Chloride: 101 mmol/L (ref 96–106)
Creatinine, Ser: 1.07 mg/dL (ref 0.76–1.27)
GFR calc Af Amer: 91 mL/min/{1.73_m2} (ref >59)
GFR calc non Af Amer: 79 mL/min/{1.73_m2} (ref >59)
Globulin, Total: 2.5 g/dL (ref 1.5–4.5)
Glucose: 83 mg/dL (ref 65–99)
Potassium: 4.1 mmol/L (ref 3.5–5.2)
Sodium: 138 mmol/L (ref 134–144)
Total Protein: 7.2 g/dL (ref 6.0–8.5)

## 2019-12-26 LAB — CBC WITH DIFFERENTIAL/PLATELET
Basophils Absolute: 0 10*3/uL (ref 0.0–0.2)
Basos: 1 %
EOS (ABSOLUTE): 0.3 10*3/uL (ref 0.0–0.4)
Eos: 4 %
Hematocrit: 50.5 % (ref 37.5–51.0)
Hemoglobin: 17.3 g/dL (ref 13.0–17.7)
Immature Grans (Abs): 0 10*3/uL (ref 0.0–0.1)
Immature Granulocytes: 1 %
Lymphocytes Absolute: 1.7 10*3/uL (ref 0.7–3.1)
Lymphs: 22 %
MCH: 31 pg (ref 26.6–33.0)
MCHC: 34.3 g/dL (ref 31.5–35.7)
MCV: 91 fL (ref 79–97)
Monocytes Absolute: 0.6 10*3/uL (ref 0.1–0.9)
Monocytes: 8 %
Neutrophils Absolute: 4.8 10*3/uL (ref 1.4–7.0)
Neutrophils: 64 %
Platelets: 207 10*3/uL (ref 150–450)
RBC: 5.58 x10E6/uL (ref 4.14–5.80)
RDW: 12.5 % (ref 11.6–15.4)
WBC: 7.5 10*3/uL (ref 3.4–10.8)

## 2019-12-26 LAB — TSH: TSH: 1.54 u[IU]/mL (ref 0.450–4.500)

## 2019-12-26 LAB — LIPID PANEL
Chol/HDL Ratio: 3.8 ratio (ref 0.0–5.0)
Cholesterol, Total: 181 mg/dL (ref 100–199)
HDL: 48 mg/dL (ref >39)
LDL Chol Calc (NIH): 112 mg/dL — ABNORMAL HIGH (ref 0–99)
Triglycerides: 114 mg/dL (ref 0–149)
VLDL Cholesterol Cal: 21 mg/dL (ref 5–40)

## 2019-12-26 LAB — PSA: Prostate Specific Ag, Serum: 0.8 ng/mL (ref 0.0–4.0)

## 2019-12-26 NOTE — Telephone Encounter (Signed)
-----   Message from Maple Hudson., MD sent at 12/26/2019 11:04 AM EDT ----- Labs stable.

## 2019-12-26 NOTE — Telephone Encounter (Signed)
Patient was given lab results through mychart and has read the providers comments.  

## 2020-05-06 ENCOUNTER — Other Ambulatory Visit: Payer: Self-pay | Admitting: Family Medicine

## 2020-05-06 NOTE — Telephone Encounter (Signed)
Requested Prescriptions  Pending Prescriptions Disp Refills   fluticasone (FLONASE) 50 MCG/ACT nasal spray [Pharmacy Med Name: FLUTICASONE NASAL SP (120) RX] 48 g 2    Sig: SHAKE LIQUID AND USE 1 TO 2 SPRAYS IN EACH NOSTRIL DAILY AS NEEDED     Ear, Nose, and Throat: Nasal Preparations - Corticosteroids Passed - 05/06/2020  3:41 AM      Passed - Valid encounter within last 12 months    Recent Outpatient Visits          4 months ago Annual physical exam   [facility] Maple Hudson., MD   1 year ago Cough   [facility] Maple Hudson., MD   1 year ago Annual physical exam   [facility] Maple Hudson., MD   1 year ago Acute prostatitis   [facility] Maple Hudson., MD   2 years ago Annual physical exam   [facility] Maple Hudson., MD      Future Appointments            In 7 months Maple Hudson., MD [facility]

## 2020-08-05 ENCOUNTER — Other Ambulatory Visit: Payer: Self-pay | Admitting: Family Medicine

## 2020-09-08 ENCOUNTER — Other Ambulatory Visit: Payer: Self-pay

## 2020-09-08 ENCOUNTER — Ambulatory Visit: Payer: BC Managed Care – PPO | Admitting: Family Medicine

## 2020-09-08 ENCOUNTER — Encounter: Payer: Self-pay | Admitting: Family Medicine

## 2020-09-08 VITALS — BP 134/87 | HR 92 | Temp 98.6°F | Resp 16 | Wt 229.0 lb

## 2020-09-08 DIAGNOSIS — M5432 Sciatica, left side: Secondary | ICD-10-CM

## 2020-09-08 DIAGNOSIS — E6609 Other obesity due to excess calories: Secondary | ICD-10-CM | POA: Diagnosis not present

## 2020-09-08 DIAGNOSIS — R1032 Left lower quadrant pain: Secondary | ICD-10-CM | POA: Diagnosis not present

## 2020-09-08 DIAGNOSIS — M5442 Lumbago with sciatica, left side: Secondary | ICD-10-CM

## 2020-09-08 DIAGNOSIS — Z6830 Body mass index (BMI) 30.0-30.9, adult: Secondary | ICD-10-CM

## 2020-09-08 DIAGNOSIS — G8929 Other chronic pain: Secondary | ICD-10-CM

## 2020-09-08 LAB — POCT URINALYSIS DIPSTICK
Bilirubin, UA: NEGATIVE
Blood, UA: NEGATIVE
Glucose, UA: NEGATIVE
Ketones, UA: NEGATIVE
Leukocytes, UA: NEGATIVE
Nitrite, UA: NEGATIVE
Protein, UA: NEGATIVE
Spec Grav, UA: <=1.005 — AB (ref 1.010–1.025)
Urobilinogen, UA: 0.2 E.U./dL
pH, UA: 6.5 (ref 5.0–8.0)

## 2020-09-08 MED ORDER — NAPROXEN 375 MG PO TABS: 375.0000 mg | ORAL_TABLET | Freq: Two times a day (BID) | ORAL | 0 refills | Status: DC

## 2020-09-08 MED ORDER — CYCLOBENZAPRINE HCL 10 MG PO TABS: 10.0000 mg | ORAL_TABLET | Freq: Every day | ORAL | 0 refills | Status: DC

## 2020-09-08 NOTE — Progress Notes (Signed)
Established patient visit   Patient: Shawn Vasquez   DOB: Jan 30, 1966   55 y.o. Male  MRN: [id number] Visit Date: 09/08/2020  Today's healthcare provider: Megan Mans, MD   Chief Complaint  Patient presents with  . Back Pain   Subjective    Back Pain This is a new problem. The current episode started more than 1 month ago. The problem occurs constantly. The problem has been gradually worsening since onset. The quality of the pain is described as aching and shooting. The pain is moderate. The pain is the same all the time. The symptoms are aggravated by stress and bending.    Pain is in the left lower back and hurts with movement.  He also noted that he has felt bloated in the left lower abdomen for the past few weeks.  Back pain started about a week ago.  No GI or GU symptoms.  Some chronic mild BPH symptoms.  No weight loss no fevers abdominal pain     Medications: Outpatient Medications Prior to Visit  Medication Sig  . ALPRAZolam (XANAX) 0.5 MG tablet Take 1 tablet (0.5 mg total) by mouth at bedtime as needed for anxiety.  . cetirizine (ZYRTEC) 10 MG tablet Take by mouth.  . cyclobenzaprine (FLEXERIL) 10 MG tablet Take 1 tablet (10 mg total) by mouth at bedtime.  . fluticasone (FLONASE) 50 MCG/ACT nasal spray SHAKE LIQUID AND USE 1 TO 2 SPRAYS IN EACH NOSTRIL DAILY AS NEEDED  . naproxen (NAPROSYN) 500 MG tablet Take 1 tablet (500 mg total) by mouth 2 (two) times daily as needed.  Marland Kitchen omeprazole (PRILOSEC) 20 MG capsule TAKE 1 CAPSULE(20 MG) BY MOUTH EVERY MORNING  . tamsulosin (FLOMAX) 0.4 MG CAPS capsule Take 1 capsule (0.4 mg total) by mouth daily.   No facility-administered medications prior to visit.    Review of Systems  Musculoskeletal: Positive for back pain.        Objective    BP 134/87   Pulse 92   Temp 98.6 F (37 C)   Resp 16   Wt 229 lb (103.9 kg)   BMI 31.06 kg/m  BP Readings from Last 3 Encounters:  09/08/20 134/87  12/25/19 138/86   07/23/18 124/83   Wt Readings from Last 3 Encounters:  09/08/20 229 lb (103.9 kg)  12/25/19 222 lb (100.7 kg)  07/23/18 226 lb 9.6 oz (102.8 kg)       Physical Exam Vitals reviewed.  Constitutional:      Appearance: He is well-developed.  HENT:     Head: Normocephalic and atraumatic.     Right Ear: External ear normal.     Left Ear: External ear normal.     Nose: Nose normal.  Eyes:     General: No scleral icterus.    Conjunctiva/sclera: Conjunctivae normal.  Neck:     Thyroid: No thyromegaly.  Cardiovascular:     Rate and Rhythm: Normal rate and regular rhythm.     Heart sounds: Normal heart sounds.  Pulmonary:     Effort: Pulmonary effort is normal.     Breath sounds: Normal breath sounds.  Abdominal:     General: There is no distension.     Palpations: Abdomen is soft.     Tenderness: There is no abdominal tenderness.  Skin:    General: Skin is warm and dry.  Neurological:     Mental Status: He is alert and oriented to person, place, and time.  Psychiatric:  Behavior: Behavior normal.        Thought Content: Thought content normal.        Judgment: Judgment normal.       No results found for any visits on 09/08/20.  Assessment & Plan     1. Low back pain with left-sided sciatica, unspecified back pain laterality, unspecified chronicity Exam today with no neurologic deficit letter physical therapy referral. - POCT urinalysis dipstick - naproxen (NAPROSYN) 375 MG tablet; Take 1 tablet (375 mg total) by mouth 2 (two) times daily with a meal.  Dispense: 60 tablet; Refill: 0  2. Left sided sciatica Exam normal.  Consider imaging in the future - cyclobenzaprine (FLEXERIL) 10 MG tablet; Take 1 tablet (10 mg total) by mouth at bedtime.  Dispense: 30 tablet; Refill: 0  3. Abdominal pain, chronic, left lower quadrant I think this is musculoskeletal pain referred pain.  Plan on seeing him back in a few weeks.  4. Class 1 obesity due to excess calories  without serious comorbidity with body mass index (BMI) of 30.0 to 30.9 in adult Diet and exercise stressed.   No follow-ups on file.      I, Megan Mans, MD, have reviewed all documentation for this visit. The documentation on 09/12/20 for the exam, diagnosis, procedures, and orders are all accurate and complete.    Arrie Wendelyn Breslow, MD  [facility] [phone] (phone) [phone] (fax)  [facility]

## 2020-09-23 ENCOUNTER — Other Ambulatory Visit: Payer: Self-pay

## 2020-09-23 ENCOUNTER — Ambulatory Visit: Payer: BC Managed Care – PPO | Admitting: Family Medicine

## 2020-09-23 ENCOUNTER — Encounter: Payer: Self-pay | Admitting: Family Medicine

## 2020-09-23 VITALS — BP 138/86 | HR 16 | Temp 98.0°F | Resp 16 | Ht 72.0 in | Wt 227.0 lb

## 2020-09-23 DIAGNOSIS — R140 Abdominal distension (gaseous): Secondary | ICD-10-CM | POA: Diagnosis not present

## 2020-09-23 DIAGNOSIS — R1032 Left lower quadrant pain: Secondary | ICD-10-CM | POA: Diagnosis not present

## 2020-09-23 DIAGNOSIS — G8929 Other chronic pain: Secondary | ICD-10-CM | POA: Diagnosis not present

## 2020-09-23 DIAGNOSIS — Z1211 Encounter for screening for malignant neoplasm of colon: Secondary | ICD-10-CM

## 2020-09-23 LAB — IFOBT (OCCULT BLOOD): IFOBT: NEGATIVE

## 2020-09-23 NOTE — Progress Notes (Signed)
I,[name],acting as a scribe for Megan Mans, MD.,have documented all relevant documentation on the behalf of Om Lizotte, MD,as directed by  Megan Mans, MD while in the presence of Megan Mans, MD.   Established patient visit   Patient: Shawn Vasquez   DOB: [birth year]   55 y.o. Male  MRN: [id number] Visit Date: 09/23/2020  Today's healthcare provider: Megan Mans, MD   Chief Complaint  Patient presents with  . Follow-up   Subjective    HPI  Low back pain is improved but patient continues to be concerned about a feeling of bloating and discomfort in his left lower quadrant and lower abdomen.  No other GI or GU complaints at this time. Low back pain with left-sided sciatica, unspecified back pain laterality, unspecified chronicity From 09/08/2020-Exam today with no neurologic deficit letter physical therapy referral. Given rx for naproxen 375 mg.  Left sided sciatica From 09/08/2020-Exam normal.  Consider imaging in the future. Given rx for cyclobenzaprine (FLEXERIL) 10 mg.  Abdominal pain, chronic, left lower quadrant From 09/08/2020-I think this is musculoskeletal pain referred pain.  Plan on seeing him back in a few weeks.   Patient is still having abdominal discomfort.      Medications: Outpatient Medications Prior to Visit  Medication Sig  . ALPRAZolam (XANAX) 0.5 MG tablet Take 1 tablet (0.5 mg total) by mouth at bedtime as needed for anxiety.  . cetirizine (ZYRTEC) 10 MG tablet Take by mouth.  . cyclobenzaprine (FLEXERIL) 10 MG tablet Take 1 tablet (10 mg total) by mouth at bedtime.  . fluticasone (FLONASE) 50 MCG/ACT nasal spray SHAKE LIQUID AND USE 1 TO 2 SPRAYS IN EACH NOSTRIL DAILY AS NEEDED  . naproxen (NAPROSYN) 375 MG tablet Take 1 tablet (375 mg total) by mouth 2 (two) times daily with a meal.  . naproxen (NAPROSYN) 500 MG tablet Take 1 tablet (500 mg total) by mouth 2 (two) times daily as needed.  Marland Kitchen omeprazole  (PRILOSEC) 20 MG capsule TAKE 1 CAPSULE(20 MG) BY MOUTH EVERY MORNING  . tamsulosin (FLOMAX) 0.4 MG CAPS capsule Take 1 capsule (0.4 mg total) by mouth daily.   No facility-administered medications prior to visit.    Review of Systems  Constitutional: Negative for appetite change, chills and fever.  Respiratory: Negative for chest tightness, shortness of breath and wheezing.   Cardiovascular: Negative for chest pain and palpitations.  Gastrointestinal: Negative for abdominal pain, nausea and vomiting.       Objective    BP 138/86 (BP Location: Right Arm, Patient Position: Sitting, Cuff Size: Large)   Pulse (!) 16   Temp 98 F (36.7 C) (Oral)   Resp 16   Ht 6' (1.829 m)   Wt 227 lb (103 kg)   SpO2 96%   BMI 30.79 kg/m     Physical Exam Vitals reviewed.  Constitutional:      Appearance: He is well-developed.  HENT:     Head: Normocephalic and atraumatic.     Right Ear: External ear normal.     Left Ear: External ear normal.     Nose: Nose normal.  Eyes:     General: No scleral icterus.    Conjunctiva/sclera: Conjunctivae normal.  Neck:     Thyroid: No thyromegaly.  Cardiovascular:     Rate and Rhythm: Normal rate and regular rhythm.     Heart sounds: Normal heart sounds.  Pulmonary:     Effort: Pulmonary effort is normal.  Breath sounds: Normal breath sounds.  Abdominal:     General: There is no distension.     Palpations: Abdomen is soft.     Tenderness: There is no abdominal tenderness.  Genitourinary:    Penis: Normal.      Testes: Normal.     Prostate: Normal.     Rectum: Normal. Guaiac result negative.  Skin:    General: Skin is warm and dry.  Neurological:     Mental Status: He is alert and oriented to person, place, and time.  Psychiatric:        Behavior: Behavior normal.        Thought Content: Thought content normal.        Judgment: Judgment normal.       No results found for any visits on 09/23/20.  Assessment & Plan     1.  Abdominal pain, chronic, left lower quadrant GYN referral to GI for work-up after ultrasound.  Would like to get CT scan for his peace of mind but GI referral and colonoscopy is probably more appropriate. - US Abdomen Complete  2. Abdominal bloating  - US Abdomen Complete  3. Encounter for screening fecal occult blood testing  - IFOBT POC (occult bld, rslt in office)--Normal    No follow-ups on file.      I, Megan Mans, MD, have reviewed all documentation for this visit. The documentation on 09/27/20 for the exam, diagnosis, procedures, and orders are all accurate and complete.    Stylianos Wendelyn Breslow, MD  [facility] [phone] (phone) [phone] (fax)  [facility]

## 2020-09-24 ENCOUNTER — Ambulatory Visit: Payer: BC Managed Care – PPO | Admitting: Family Medicine

## 2020-10-01 ENCOUNTER — Ambulatory Visit
Admission: RE | Admit: 2020-10-01 | Discharge: 2020-10-01 | Disposition: A | Discharge status: Home / Self Care | Payer: BC Managed Care – PPO | Source: Ambulatory Visit | Attending: Family Medicine | Admitting: Family Medicine

## 2020-10-01 ENCOUNTER — Other Ambulatory Visit: Payer: Self-pay

## 2020-10-01 DIAGNOSIS — R1032 Left lower quadrant pain: Secondary | ICD-10-CM | POA: Diagnosis not present

## 2020-10-01 DIAGNOSIS — G8929 Other chronic pain: Secondary | ICD-10-CM | POA: Insufficient documentation

## 2020-10-01 DIAGNOSIS — R109 Unspecified abdominal pain: Secondary | ICD-10-CM | POA: Diagnosis not present

## 2020-10-01 DIAGNOSIS — R140 Abdominal distension (gaseous): Secondary | ICD-10-CM | POA: Insufficient documentation

## 2020-10-05 ENCOUNTER — Other Ambulatory Visit: Payer: Self-pay | Admitting: Family Medicine

## 2020-10-05 DIAGNOSIS — M5442 Lumbago with sciatica, left side: Secondary | ICD-10-CM

## 2020-10-19 ENCOUNTER — Other Ambulatory Visit: Payer: Self-pay

## 2020-10-19 ENCOUNTER — Telehealth: Payer: Self-pay

## 2020-10-19 DIAGNOSIS — Z125 Encounter for screening for malignant neoplasm of prostate: Secondary | ICD-10-CM

## 2020-10-19 DIAGNOSIS — Z0000 Encounter for general adult medical examination without abnormal findings: Secondary | ICD-10-CM

## 2020-10-19 NOTE — Telephone Encounter (Signed)
Patient returned call requesting information on GI referral.  I do not see that the referral was ever placed.  Also requesting that he be able to get his labs ordered since his CPE has been rescheduled again.  I have placed the order for the labs and referral but need for yo to detail the referral (diagnosis)   Copied from CRM [id number]. Topic: Appointment Scheduling - Scheduling Inquiry for Clinic >> Oct 19, 2020  3:40 PM Pawlus, Maxine Glenn A wrote: Reason for CRM: Pt was frustrated that his CPE has been moved again, Pts new CPE is 6/22. Please advise if there is anyway to get him in sooner since his physicals keep getting cancelled and pushed back.

## 2020-10-20 ENCOUNTER — Other Ambulatory Visit: Payer: Self-pay | Admitting: *Deleted

## 2020-10-20 ENCOUNTER — Telehealth: Payer: Self-pay

## 2020-10-20 DIAGNOSIS — Z0000 Encounter for general adult medical examination without abnormal findings: Secondary | ICD-10-CM

## 2020-10-20 DIAGNOSIS — Z125 Encounter for screening for malignant neoplasm of prostate: Secondary | ICD-10-CM

## 2020-10-20 DIAGNOSIS — Z1211 Encounter for screening for malignant neoplasm of colon: Secondary | ICD-10-CM

## 2020-10-20 DIAGNOSIS — R739 Hyperglycemia, unspecified: Secondary | ICD-10-CM

## 2020-10-20 NOTE — Telephone Encounter (Signed)
Referral and labs ordered. Left detailed message on vm.

## 2020-10-20 NOTE — Telephone Encounter (Signed)
Copied from CRM [id number]. Topic: General - Other >> Oct 20, 2020 12:13 PM Gwenlyn Fudge wrote: Reason for CRM: Pt called and is requesting to speak with Marcelino Duster regarding the colonoscopy that was scheduled for him. He states that is not needing that and that he would like to speak with her regarding his labs as well. Please advise.

## 2020-10-20 NOTE — Telephone Encounter (Signed)
Please place GI referral for lower abdominal discomfort.  Patient has never had colonoscopy either.

## 2020-10-22 ENCOUNTER — Other Ambulatory Visit: Payer: Self-pay | Admitting: *Deleted

## 2020-10-22 DIAGNOSIS — G8929 Other chronic pain: Secondary | ICD-10-CM

## 2020-10-22 NOTE — Progress Notes (Signed)
Reordered referral.

## 2020-10-26 ENCOUNTER — Encounter: Payer: Self-pay | Admitting: *Deleted

## 2020-11-03 ENCOUNTER — Other Ambulatory Visit: Payer: Self-pay | Admitting: Family Medicine

## 2020-11-16 ENCOUNTER — Ambulatory Visit: Payer: BC Managed Care – PPO | Admitting: Gastroenterology

## 2020-11-16 ENCOUNTER — Encounter: Payer: Self-pay | Admitting: Gastroenterology

## 2020-11-16 ENCOUNTER — Other Ambulatory Visit: Payer: Self-pay

## 2020-11-16 VITALS — BP 144/97 | HR 89 | Temp 97.5°F | Ht 72.0 in | Wt 222.4 lb

## 2020-11-16 DIAGNOSIS — R7989 Other specified abnormal findings of blood chemistry: Secondary | ICD-10-CM | POA: Diagnosis not present

## 2020-11-16 DIAGNOSIS — K760 Fatty (change of) liver, not elsewhere classified: Secondary | ICD-10-CM

## 2020-11-16 DIAGNOSIS — R1084 Generalized abdominal pain: Secondary | ICD-10-CM

## 2020-11-16 NOTE — Patient Instructions (Signed)
High-Fiber Eating Plan Fiber, also called dietary fiber, is a type of carbohydrate. It is found foods such as fruits, vegetables, whole grains, and beans. A high-fiber diet can have many health benefits. Your health care provider may recommend a high-fiber diet to help:  Prevent constipation. Fiber can make your bowel movements more regular.  Lower your cholesterol.  Relieve the following conditions: ? Inflammation of veins in the anus (hemorrhoids). ? Inflammation of specific areas of the digestive tract (uncomplicated diverticulosis). ? A problem of the large intestine, also called the colon, that sometimes causes pain and diarrhea (irritable bowel syndrome, or IBS).  Prevent overeating as part of a weight-loss plan.  Prevent heart disease, type 2 diabetes, and certain cancers. What are tips for following this plan? Reading food labels  Check the nutrition facts label on food products for the amount of dietary fiber. Choose foods that have 5 grams of fiber or more per serving.  The goals for recommended daily fiber intake include: ? Men (age 50 or younger): 34-38 g. ? Men (over age 50): 28-34 g. ? Women (age 50 or younger): 25-28 g. ? Women (over age 50): 22-25 g. Your daily fiber goal is _____________ g.   Shopping  Choose whole fruits and vegetables instead of processed forms, such as apple juice or applesauce.  Choose a wide variety of high-fiber foods such as avocados, lentils, oats, and kidney beans.  Read the nutrition facts label of the foods you choose. Be aware of foods with added fiber. These foods often have high sugar and sodium amounts per serving. Cooking  Use whole-grain flour for baking and cooking.  Cook with brown rice instead of white rice. Meal planning  Start the day with a breakfast that is high in fiber, such as a cereal that contains 5 g of fiber or more per serving.  Eat breads and cereals that are made with whole-grain flour instead of refined  flour or white flour.  Eat brown rice, bulgur wheat, or millet instead of white rice.  Use beans in place of meat in soups, salads, and pasta dishes.  Be sure that half of the grains you eat each day are whole grains. General information  You can get the recommended daily intake of dietary fiber by: ? Eating a variety of fruits, vegetables, grains, nuts, and beans. ? Taking a fiber supplement if you are not able to take in enough fiber in your diet. It is better to get fiber through food than from a supplement.  Gradually increase how much fiber you consume. If you increase your intake of dietary fiber too quickly, you may have bloating, cramping, or gas.  Drink plenty of water to help you digest fiber.  Choose high-fiber snacks, such as berries, raw vegetables, nuts, and popcorn. What foods should I eat? Fruits Berries. Pears. Apples. Oranges. Avocado. Prunes and raisins. Dried figs. Vegetables Sweet potatoes. Spinach. Kale. Artichokes. Cabbage. Broccoli. Cauliflower. Green peas. Carrots. Squash. Grains Whole-grain breads. Multigrain cereal. Oats and oatmeal. Brown rice. Barley. Bulgur wheat. Millet. Quinoa. Bran muffins. Popcorn. Rye wafer crackers. Meats and other proteins Navy beans, kidney beans, and pinto beans. Soybeans. Split peas. Lentils. Nuts and seeds. Dairy Fiber-fortified yogurt. Beverages Fiber-fortified soy milk. Fiber-fortified orange juice. Other foods Fiber bars. The items listed above may not be a complete list of recommended foods and beverages. Contact a dietitian for more information. What foods should I avoid? Fruits Fruit juice. Cooked, strained fruit. Vegetables Fried potatoes. Canned vegetables. Well-cooked vegetables. Grains   White bread. Pasta made with refined flour. White rice. Meats and other proteins Fatty cuts of meat. Fried chicken or fried fish. Dairy Milk. Yogurt. Cream cheese. Sour cream. Fats and oils Butters. Beverages Soft  drinks. Other foods Cakes and pastries. The items listed above may not be a complete list of foods and beverages to avoid. Talk with your dietitian about what choices are best for you. Summary  Fiber is a type of carbohydrate. It is found in foods such as fruits, vegetables, whole grains, and beans.  A high-fiber diet has many benefits. It can help to prevent constipation, lower blood cholesterol, aid weight loss, and reduce your risk of heart disease, diabetes, and certain cancers.  Increase your intake of fiber gradually. Increasing fiber too quickly may cause cramping, bloating, and gas. Drink plenty of water while you increase the amount of fiber you consume.  The best sources of fiber include whole fruits and vegetables, whole grains, nuts, seeds, and beans. This information is not intended to replace advice given to you by your health care provider. Make sure you discuss any questions you have with your health care provider. Document Revised: 11/21/2019 Document Reviewed: 11/21/2019 Elsevier Patient Education  2021 Elsevier Inc. Nonalcoholic Fatty Liver Disease Diet, Adult Nonalcoholic fatty liver disease is a condition that causes fat to build up in and around the liver. The disease makes it harder for the liver to work the way that it should. Following a healthy diet can help to keep nonalcoholic fatty liver disease under control. It can also help to prevent or improve conditions that are associated with the disease, such as heart disease, diabetes, high blood pressure, and abnormal cholesterol levels. Along with regular exercise, this diet:  Promotes weight loss.  Helps to control blood sugar levels.  Helps to improve the way that the body uses insulin. What are tips for following this plan? Reading food labels Always check food labels for:  The amount of saturated fat in a food. You should limit your intake of saturated fat. Saturated fat is found in foods that come from  animals, including meat and dairy products such as butter, cheese, and whole milk.  The amount of fiber in a food. You should choose high-fiber foods such as fruits, vegetables, and whole grains. Try to get 25-30 grams (g) of fiber a day.   Cooking  When cooking, use heart-healthy oils that are high in monounsaturated fats. These include olive oil, canola oil, and avocado oil.  Limit frying or deep-frying foods. Cook foods using healthy methods such as baking, boiling, steaming, and grilling instead. Meal planning  You may want to keep track of how many calories you take in. Eating the right amount of calories will help you achieve a healthy weight. Meeting with a registered dietitian can help you get started.  Limit how often you eat takeout and fast food. These foods are usually very high in fat, salt, and sugar.  Use the glycemic index (GI) to plan your meals. The index tells you how quickly a food will raise your blood sugar. Choose low-GI foods (GI less than 55). These foods take a longer time to raise blood sugar. A registered dietitian can help you identify foods lower on the GI scale. Lifestyle  You may want to follow a Mediterranean diet. This diet includes a lot of vegetables, lean meats or fish, whole grains, fruits, and healthy oils and fats. What foods can I eat? Fruits Bananas. Apples. Oranges. Grapes. Papaya. Mango. Pomegranate.  Kiwi. Grapefruit. Cherries. Vegetables Lettuce. Spinach. Peas. Beets. Cauliflower. Cabbage. Broccoli. Carrots. Tomatoes. Squash. Eggplant. Herbs. Peppers. Onions. Cucumbers. Brussels sprouts. Yams and sweet potatoes. Beans. Lentils. Grains Whole wheat or whole-grain foods, including breads, crackers, cereals, and pasta. Stone-ground whole wheat. Unsweetened oatmeal. Bulgur. Barley. Quinoa. Brown or wild rice. Corn or whole wheat flour tortillas. Meats and other proteins Lean meats. Poultry. Tofu. Seafood and shellfish. Dairy Low-fat or fat-free  dairy products, such as yogurt, cottage cheese, or cheese. Beverages Water. Sugar-free drinks. Tea. Coffee. Low-fat or skim milk. Milk alternatives, such as soy or almond milk. Real fruit juice. Fats and oils Avocado. Canola or olive oil. Nuts and nut butters. Seeds. Seasonings and condiments Mustard. Relish. Low-fat, low-sugar ketchup and barbecue sauce. Low-fat or fat-free mayonnaise. Sweets and desserts Sugar-free sweets. The items listed above may not be a complete list of foods and beverages you can eat. Contact a dietitian for more information.   What foods should I limit or avoid? Meats and other proteins Limit red meat to 1-2 times a week. Dairy [employer]. Fats and oils Palm oil and coconut oil. Fried foods. Other foods Processed foods. Foods that contain a lot of salt or sodium. Sweets and desserts Sweets that contain sugar. Beverages Sweetened drinks, such as sweet tea, milkshakes, iced sweet drinks, and sodas. Alcohol. The items listed above may not be a complete list of foods and beverages you should avoid. Contact a dietitian for more information. Where to find more information The [employer] of Diabetes and Digestive and Kidney Diseases: [email or web address] Summary  Nonalcoholic fatty liver disease is a condition that causes fat to build up in and around the liver.  Following a healthy diet can help to keep nonalcoholic fatty liver disease under control. Your diet should be rich in fruits, vegetables, whole grains, and lean proteins.  Limit your intake of saturated fat. Saturated fat is found in foods that come from animals, including meat and dairy products such as butter, cheese, and whole milk.  This diet promotes weight loss, helps to control blood sugar levels, and helps to improve the way that the body uses insulin. This information is not intended to replace advice given to you by your health care provider. Make sure you discuss any questions you have  with your health care provider. Document Revised: 11/09/2018 Document Reviewed: 08/09/2018 Elsevier Patient Education  2021 [employer].

## 2020-11-17 NOTE — Progress Notes (Signed)
Shawn Vasquez Vasquez [address]  [address]  [place], [place] [address]  Main: [phone]  Fax: [phone]   Gastroenterology Consultation  Referring Provider:     Maple Vasquez.,* Primary Care Physician:  Shawn Vasquez Vasquez., MD Reason for Consultation:    Abdominal pain        HPI:    Chief Complaint  Patient presents with  . Abdominal Pain    Shawn Vasquez Vasquez is a 55 y.o. y/o male referred for consultation & management  by Shawn Vasquez. Shawn Vasquez Vasquez, Shawn Vasquez Vasquez., MD.  Pt reports abdominal pain associated with bloating, for the last 4 to 6 months right upper and lower to the mid quadrants.  Dull, 5 or 10, nonradiating.  However, patient states he used to have constipation and has started using miracle tea and having better bowel movements with this and symptoms have started to improve.  No nausea or vomiting.  No weight loss.  No blood in stool.  Last colonoscopy was in 2019 with [facility] GI, procedure report not available, but patient states it was normal and he was asked to follow-up in 10 years.  No prior upper endoscopy.  Due to his symptoms, patient underwent ultrasound abdomen ordered by PCP, that showed fatty liver and it was otherwise reassuring.  Patient is also been on omeprazole once a day which has helped with heartburn, but not the above symptoms.  No dysphagia.  Past Medical History:  Diagnosis Date  . Prostate infection 1990's    Past Surgical History:  Procedure Laterality Date  . ACHILLES TENDON REPAIR    . HERNIA REPAIR     inguinal and umbilical  . OTHER SURGICAL HISTORY     bronchial cleft cyst removed  . TONSILLECTOMY AND ADENOIDECTOMY      Prior to Admission medications   Medication Sig Start Date End Date Taking? Authorizing Provider  fluticasone (FLONASE) 50 MCG/ACT nasal spray SHAKE LIQUID AND USE 1 TO 2 SPRAYS IN EACH NOSTRIL DAILY AS NEEDED 05/06/20  Yes Shawn Vasquez Vasquez., MD  omeprazole (PRILOSEC) 20 MG capsule TAKE 1  CAPSULE(20 MG) BY MOUTH EVERY MORNING 11/03/20  Yes Shawn Vasquez Vasquez., MD  ALPRAZolam Prudy Feeler) 0.5 MG tablet Take 1 tablet (0.5 mg total) by mouth at bedtime as needed for anxiety. Patient not taking: Reported on 11/16/2020 07/23/18   Shawn Vasquez Vasquez., MD  cetirizine (ZYRTEC) 10 MG tablet Take by mouth. Patient not taking: Reported on 11/16/2020 03/07/12   [provider]  cyclobenzaprine (FLEXERIL) 10 MG tablet Take 1 tablet (10 mg total) by mouth at bedtime. Patient not taking: Reported on 11/16/2020 09/08/20   Shawn Vasquez Vasquez., MD  tamsulosin (FLOMAX) 0.4 MG CAPS capsule Take 1 capsule (0.4 mg total) by mouth daily. Patient not taking: Reported on 11/16/2020 09/26/18   Shawn Vasquez Vasquez., MD    Family History  Problem Relation Age of Onset  . Heart attack Father   . Seizures Father   . Lung cancer Father   . Prostate cancer Maternal Uncle   . Dementia Maternal Grandmother   . Colon cancer Paternal Grandmother   . Skin cancer Paternal Grandfather      Social History   Tobacco Use  . Smoking status: Never Smoker  . Smokeless tobacco: Never Used  Vaping Use  . Vaping Use: Never used  Substance Use Topics  . Alcohol use: Yes    Comment: occasionally  . Drug use: No  Allergies as of 11/16/2020  . (No Known Allergies)    Review of Systems:    All systems reviewed and negative except where noted in HPI.   Physical Exam:  BP (!) 144/97   Pulse 89   Temp (!) 97.5 F (36.4 C) (Oral)   Ht 6' (1.829 m)   Wt 222 lb 6.4 oz (100.9 kg)   BMI 30.16 kg/m  No LMP for male patient. Psych:  Alert and cooperative. Normal mood and affect. General:   Alert,  Well-developed, well-nourished, pleasant and cooperative in NAD Head:  Normocephalic and atraumatic. Eyes:  Sclera clear, no icterus.   Conjunctiva pink. Ears:  Normal auditory acuity. Nose:  No deformity, discharge, or lesions. Mouth:  No deformity or lesions,oropharynx pink & moist. Neck:  Supple; no  masses or thyromegaly. Abdomen:  Normal bowel sounds.  No bruits.  Soft, non-tender and non-distended without masses, hepatosplenomegaly or hernias noted.  No guarding or rebound tenderness.    Msk:  Symmetrical without gross deformities. Good, equal movement & strength bilaterally. Pulses:  Normal pulses noted. Extremities:  No clubbing or edema.  No cyanosis. Neurologic:  Alert and oriented x3;  grossly normal neurologically. Skin:  Intact without significant lesions or rashes. No jaundice. Lymph Nodes:  No significant cervical adenopathy. Psych:  Alert and cooperative. Normal mood and affect.   Labs: CBC    Component Value Date/Time   WBC 7.5 12/25/2019 1135   WBC 6.8 06/08/2017 0939   RBC 5.58 12/25/2019 1135   RBC 5.28 06/08/2017 0939   HGB 17.3 12/25/2019 1135   HCT 50.5 12/25/2019 1135   PLT 207 12/25/2019 1135   MCV 91 12/25/2019 1135   MCH 31.0 12/25/2019 1135   MCH 31.8 06/08/2017 0939   MCHC 34.3 12/25/2019 1135   MCHC 35.6 06/08/2017 0939   RDW 12.5 12/25/2019 1135   LYMPHSABS 1.7 12/25/2019 1135   EOSABS 0.3 12/25/2019 1135   BASOSABS 0.0 12/25/2019 1135   CMP     Component Value Date/Time   NA 138 12/25/2019 1135   K 4.1 12/25/2019 1135   CL 101 12/25/2019 1135   CO2 22 12/25/2019 1135   GLUCOSE 83 12/25/2019 1135   GLUCOSE 102 (H) 06/08/2017 0939   BUN 14 12/25/2019 1135   CREATININE 1.07 12/25/2019 1135   CREATININE 0.94 06/08/2017 0939   CALCIUM 9.7 12/25/2019 1135   PROT WILL FOLLOW 11/16/2020 1540   ALBUMIN 4.9 11/16/2020 1540   AST 26 11/16/2020 1540   ALT 35 11/16/2020 1540   ALKPHOS 54 11/16/2020 1540   BILITOT 0.3 11/16/2020 1540   GFRNONAA 79 12/25/2019 1135   GFRNONAA 94 06/08/2017 0939   GFRAA 91 12/25/2019 1135   GFRAA 109 06/08/2017 0939    Imaging Studies: No results found.  Assessment and Plan:   Shawn Vasquez Vasquez is a 55 y.o. y/o male has been referred for abdominal pain and bloating  Symptoms have improved with improved  bowel movements, however, patient is still worried about the ongoing symptoms and is convinced that these are not from musculoskeletal etiology, as he visibly sees "swelling" when the symptoms do occur.  We talked about starting MiraLAX once daily to allow for better evacuation and more regular bowel movements, however, patient states the miracle tea is working well for him in that regard and he would like to continue that instead of trying MiraLAX.  Patient also reports history of inguinal and umbilical hernia surgery years ago.  Will obtain CT abdomen pelvis for  further evaluation given ongoing symptoms  We will also obtain H. pylori serology  Finding of fatty liver on imaging discussed with patient Diet, weight loss, and exercise encouraged along with avoiding hepatotoxic drugs including alcohol Risk of progression to cirrhosis if above measures are not instituted were discussed as well, and patient verbalized understanding  Work-up on Friday liver ordered    Shawn Vasquez Vasquez  Speech recognition software was used to dictate the above note.

## 2020-11-17 NOTE — Addendum Note (Signed)
Addended by: Melodie Bouillon on: 11/17/2020 08:48 AM   Modules accepted: Orders

## 2020-11-19 LAB — MITOCHONDRIAL/SMOOTH MUSCLE AB PNL
Mitochondrial Ab: <20 Units (ref 0.0–20.0)
Smooth Muscle Ab: 44 Units — ABNORMAL HIGH (ref 0–19)

## 2020-11-19 LAB — HEPATITIS B SURFACE ANTIGEN: Hepatitis B Surface Ag: NEGATIVE

## 2020-11-19 LAB — ANA: ANA Titer 1: NEGATIVE

## 2020-11-19 LAB — H PYLORI, IGM, IGG, IGA AB
H pylori, IgM Abs: <9 units (ref 0.0–8.9)
H. pylori, IgA Abs: <9 units (ref 0.0–8.9)
H. pylori, IgG AbS: 0.34 Index Value (ref 0.00–0.79)

## 2020-11-19 LAB — HEPATITIS C ANTIBODY: Hep C Virus Ab: <0.1 s/co ratio (ref 0.0–0.9)

## 2020-11-19 LAB — FERRITIN: Ferritin: 545 ng/mL — ABNORMAL HIGH (ref 30–400)

## 2020-11-19 LAB — IRON AND TIBC
Iron Saturation: 37 % (ref 15–55)
Iron: 118 ug/dL (ref 38–169)
Total Iron Binding Capacity: 317 ug/dL (ref 250–450)
UIBC: 199 ug/dL (ref 111–343)

## 2020-11-19 LAB — HEPATITIS B CORE ANTIBODY, TOTAL: Hep B Core Total Ab: NEGATIVE

## 2020-11-19 LAB — ANTI-MICROSOMAL ANTIBODY LIVER / KIDNEY: LKM1 Ab: 0.9 Units (ref 0.0–20.0)

## 2020-11-19 LAB — HEPATITIS B SURFACE ANTIBODY,QUALITATIVE: Hep B Surface Ab, Qual: NONREACTIVE

## 2020-11-19 LAB — HEPATIC FUNCTION PANEL
ALT: 35 IU/L (ref 0–44)
AST: 26 IU/L (ref 0–40)
Albumin: 4.9 g/dL (ref 3.8–4.9)
Alkaline Phosphatase: 54 IU/L (ref 44–121)
Bilirubin Total: 0.3 mg/dL (ref 0.0–1.2)
Bilirubin, Direct: 0.11 mg/dL (ref 0.00–0.40)
Total Protein: 7.9 g/dL (ref 6.0–8.5)

## 2020-11-19 LAB — HEPATITIS A ANTIBODY, TOTAL: hep A Total Ab: NEGATIVE

## 2020-11-19 LAB — IGG: IgG (Immunoglobin G), Serum: 1034 mg/dL (ref 603–1613)

## 2020-11-19 LAB — CERULOPLASMIN: Ceruloplasmin: 21.6 mg/dL (ref 16.0–31.0)

## 2020-12-02 ENCOUNTER — Ambulatory Visit: Payer: BC Managed Care – PPO

## 2020-12-03 DIAGNOSIS — R7989 Other specified abnormal findings of blood chemistry: Secondary | ICD-10-CM | POA: Diagnosis not present

## 2020-12-09 LAB — HEMOCHROMATOSIS DNA-PCR(C282Y,H63D)

## 2020-12-22 ENCOUNTER — Ambulatory Visit: Payer: BC Managed Care – PPO

## 2020-12-23 DIAGNOSIS — D2261 Melanocytic nevi of right upper limb, including shoulder: Secondary | ICD-10-CM | POA: Diagnosis not present

## 2020-12-23 DIAGNOSIS — L570 Actinic keratosis: Secondary | ICD-10-CM | POA: Diagnosis not present

## 2020-12-23 DIAGNOSIS — B351 Tinea unguium: Secondary | ICD-10-CM | POA: Diagnosis not present

## 2020-12-23 DIAGNOSIS — D2262 Melanocytic nevi of left upper limb, including shoulder: Secondary | ICD-10-CM | POA: Diagnosis not present

## 2020-12-23 DIAGNOSIS — X32XXXA Exposure to sunlight, initial encounter: Secondary | ICD-10-CM | POA: Diagnosis not present

## 2020-12-23 DIAGNOSIS — D225 Melanocytic nevi of trunk: Secondary | ICD-10-CM | POA: Diagnosis not present

## 2020-12-30 ENCOUNTER — Encounter: Payer: Self-pay | Admitting: Family Medicine

## 2021-01-20 ENCOUNTER — Encounter: Payer: Self-pay | Admitting: Family Medicine

## 2021-02-01 ENCOUNTER — Other Ambulatory Visit: Payer: Self-pay | Admitting: Family Medicine

## 2021-02-04 ENCOUNTER — Other Ambulatory Visit: Payer: Self-pay

## 2021-02-04 ENCOUNTER — Ambulatory Visit (INDEPENDENT_AMBULATORY_CARE_PROVIDER_SITE_OTHER): Payer: BC Managed Care – PPO | Admitting: Family Medicine

## 2021-02-04 ENCOUNTER — Encounter: Payer: Self-pay | Admitting: Family Medicine

## 2021-02-04 VITALS — BP 125/90 | HR 91 | Temp 98.0°F | Resp 16 | Ht 72.0 in | Wt 206.0 lb

## 2021-02-04 DIAGNOSIS — R739 Hyperglycemia, unspecified: Secondary | ICD-10-CM | POA: Diagnosis not present

## 2021-02-04 DIAGNOSIS — Z0000 Encounter for general adult medical examination without abnormal findings: Secondary | ICD-10-CM | POA: Diagnosis not present

## 2021-02-04 DIAGNOSIS — Z125 Encounter for screening for malignant neoplasm of prostate: Secondary | ICD-10-CM

## 2021-02-04 NOTE — Progress Notes (Signed)
Complete physical exam   Patient: Shawn Vasquez   DOB: 1966/04/12   55 y.o. Male  MRN: [id number] Visit Date: 02/04/2021  Today's healthcare provider: Megan Mans, MD   Chief Complaint  Patient presents with   Annual Exam   Subjective    Shawn Vasquez is a 55 y.o. male who presents today for a complete physical exam.  He reports consuming a general diet. Home exercise routine includes walking and cardio. He generally feels well. He reports sleeping well. He does not have additional problems to discuss today.  He is a married Visual merchandiser.  He has lost 16 pounds this year with dietary changes.  Past Medical History:  Diagnosis Date   Prostate infection 1990's   Past Surgical History:  Procedure Laterality Date   ACHILLES TENDON REPAIR     HERNIA REPAIR     inguinal and umbilical   OTHER SURGICAL HISTORY     bronchial cleft cyst removed   TONSILLECTOMY AND ADENOIDECTOMY     Social History   Socioeconomic History   Marital status: Married    Spouse name: Not on file   Number of children: Not on file   Years of education: Not on file   Highest education level: Not on file  Occupational History   Not on file  Tobacco Use   Smoking status: Never   Smokeless tobacco: Never  Vaping Use   Vaping Use: Never used  Substance and Sexual Activity   Alcohol use: Yes    Comment: occasionally   Drug use: No   Sexual activity: Yes  Other Topics Concern   Not on file  Social History Narrative   Not on file   Social Determinants of Health   Financial Resource Strain: Not on file  Food Insecurity: Not on file  Transportation Needs: Not on file  Physical Activity: Not on file  Stress: Not on file  Social Connections: Not on file  Intimate Partner Violence: Not on file   Family Status  Relation Name Status   Mother  Alive   Father  Deceased   Sister  Alive   Mat Uncle  (Not Specified)   MGM  (Not Specified)   PGM  (Not Specified)   PGF  (Not Specified)    Family History  Problem Relation Age of Onset   Heart attack Father    Seizures Father    Lung cancer Father    Prostate cancer Maternal Uncle    Dementia Maternal Grandmother    Colon cancer Paternal Grandmother    Skin cancer Paternal Grandfather    No Known Allergies  Patient Care Team: Maple Hudson., MD as PCP - General (Family Medicine)   Medications: Outpatient Medications Prior to Visit  Medication Sig   fluticasone (FLONASE) 50 MCG/ACT nasal spray SHAKE LIQUID AND USE 1 TO 2 SPRAYS IN EACH NOSTRIL DAILY AS NEEDED   omeprazole (PRILOSEC) 20 MG capsule TAKE 1 CAPSULE(20 MG) BY MOUTH EVERY MORNING   ALPRAZolam (XANAX) 0.5 MG tablet Take 1 tablet (0.5 mg total) by mouth at bedtime as needed for anxiety. (Patient not taking: No sig reported)   cetirizine (ZYRTEC) 10 MG tablet Take by mouth. (Patient not taking: No sig reported)   cyclobenzaprine (FLEXERIL) 10 MG tablet Take 1 tablet (10 mg total) by mouth at bedtime. (Patient not taking: No sig reported)   tamsulosin (FLOMAX) 0.4 MG CAPS capsule Take 1 capsule (0.4 mg total) by mouth daily. (Patient not  taking: No sig reported)   No facility-administered medications prior to visit.    Review of Systems  All other systems reviewed and are negative.     Objective    BP 125/90   Pulse 91   Temp 98 F (36.7 C)   Resp 16   Ht 6' (1.829 m)   Wt 206 lb (93.4 kg)   BMI 27.94 kg/m     Physical Exam Vitals and nursing note reviewed.  Constitutional:      Appearance: Normal appearance. He is normal weight.  HENT:     Right Ear: Tympanic membrane normal.     Left Ear: Tympanic membrane normal.     Nose: Nose normal.     Mouth/Throat:     Mouth: Mucous membranes are moist.  Eyes:     General: No scleral icterus.    Conjunctiva/sclera: Conjunctivae normal.  Cardiovascular:     Rate and Rhythm: Normal rate and regular rhythm.     Pulses: Normal pulses.     Heart sounds: Normal heart sounds.  Pulmonary:      Effort: Pulmonary effort is normal.     Breath sounds: Normal breath sounds.  Abdominal:     General: Bowel sounds are normal.     Palpations: Abdomen is soft.  Genitourinary:    Penis: Normal.      Testes: Normal.  Musculoskeletal:     Cervical back: Normal range of motion and neck supple.     Right lower leg: No edema.     Left lower leg: No edema.  Skin:    General: Skin is warm.  Neurological:     Mental Status: He is alert.  Psychiatric:        Mood and Affect: Mood normal.        Behavior: Behavior normal.        Thought Content: Thought content normal.        Judgment: Judgment normal.      Last depression screening scores PHQ 2/9 Scores 02/04/2021 12/25/2019 07/23/2018  PHQ - 2 Score 0 0 0  PHQ- 9 Score 0 0 0   Last fall risk screening Fall Risk  02/04/2021  Falls in the past year? 0  Number falls in past yr: 0  Injury with Fall? 0  Risk for fall due to : No Fall Risks  Follow up Falls evaluation completed   Last Audit-C alcohol use screening Alcohol Use Disorder Test (AUDIT) 02/04/2021  1. How often do you have a drink containing alcohol? 2  2. How many drinks containing alcohol do you have on a typical day when you are drinking? 0  3. How often do you have six or more drinks on one occasion? 0  AUDIT-C Score 2   A score of 3 or more in women, and 4 or more in men indicates increased risk for alcohol abuse, EXCEPT if all of the points are from question 1   No results found for any visits on 02/04/21.  Assessment & Plan    Routine Health Maintenance and Physical Exam  Exercise Activities and Dietary recommendations  Goals   None     Immunization History  Administered Date(s) Administered   Tdap 05/30/2014    Health Maintenance  Topic Date Due   COVID-19 Vaccine (1) Never done   HIV Screening  Never done   Zoster Vaccines- Shingrix (1 of 2) Never done   INFLUENZA VACCINE  06/13/2024 (Originally 03/01/2021)   TETANUS/TDAP  05/30/2024  COLONOSCOPY  (Pts 45-78yrs Insurance coverage will need to be confirmed)  10/18/2027   Hepatitis C Screening  Completed   Pneumococcal Vaccine 85-54 Years old  Aged Out   HPV VACCINES  Aged Out    Discussed health benefits of physical activity, and encouraged him to engage in regular exercise appropriate for his age and condition.  1. Annual physical exam Follow-up 1 year - CBC with Differential/Platelet - Comprehensive metabolic panel - Lipid panel - TSH  2. Prostate cancer screening  - PSA  3. Hyperglycemia Follow-up A1c and patient is working on diet and exercise. - Hemoglobin A1c   No follow-ups on file.     I, Megan Mans, MD, have reviewed all documentation for this visit. The documentation on 02/10/21 for the exam, diagnosis, procedures, and orders are all accurate and complete.    Margaret Wendelyn Breslow, MD  [facility] [phone] (phone) [phone] (fax)  [facility]

## 2021-02-05 LAB — LIPID PANEL
Chol/HDL Ratio: 3.6 ratio (ref 0.0–5.0)
Cholesterol, Total: 185 mg/dL (ref 100–199)
HDL: 51 mg/dL (ref >39)
LDL Chol Calc (NIH): 113 mg/dL — ABNORMAL HIGH (ref 0–99)
Triglycerides: 115 mg/dL (ref 0–149)
VLDL Cholesterol Cal: 21 mg/dL (ref 5–40)

## 2021-02-05 LAB — CBC WITH DIFFERENTIAL/PLATELET
Basophils Absolute: 0 10*3/uL (ref 0.0–0.2)
Basos: 0 %
EOS (ABSOLUTE): 0.3 10*3/uL (ref 0.0–0.4)
Eos: 3 %
Hematocrit: 47.9 % (ref 37.5–51.0)
Hemoglobin: 16.9 g/dL (ref 13.0–17.7)
Immature Grans (Abs): 0 10*3/uL (ref 0.0–0.1)
Immature Granulocytes: 0 %
Lymphocytes Absolute: 1.7 10*3/uL (ref 0.7–3.1)
Lymphs: 16 %
MCH: 31.6 pg (ref 26.6–33.0)
MCHC: 35.3 g/dL (ref 31.5–35.7)
MCV: 90 fL (ref 79–97)
Monocytes Absolute: 0.8 10*3/uL (ref 0.1–0.9)
Monocytes: 8 %
Neutrophils Absolute: 7.9 10*3/uL — ABNORMAL HIGH (ref 1.4–7.0)
Neutrophils: 73 %
Platelets: 203 10*3/uL (ref 150–450)
RBC: 5.35 x10E6/uL (ref 4.14–5.80)
RDW: 12.5 % (ref 11.6–15.4)
WBC: 10.8 10*3/uL (ref 3.4–10.8)

## 2021-02-05 LAB — COMPREHENSIVE METABOLIC PANEL
ALT: 26 IU/L (ref 0–44)
AST: 22 IU/L (ref 0–40)
Albumin/Globulin Ratio: 1.5 (ref 1.2–2.2)
Albumin: 4.6 g/dL (ref 3.8–4.9)
Alkaline Phosphatase: 55 IU/L (ref 44–121)
BUN/Creatinine Ratio: 12 (ref 9–20)
BUN: 13 mg/dL (ref 6–24)
Bilirubin Total: 0.6 mg/dL (ref 0.0–1.2)
CO2: 24 mmol/L (ref 20–29)
Calcium: 10.1 mg/dL (ref 8.7–10.2)
Chloride: 99 mmol/L (ref 96–106)
Creatinine, Ser: 1.05 mg/dL (ref 0.76–1.27)
Globulin, Total: 3 g/dL (ref 1.5–4.5)
Glucose: 89 mg/dL (ref 65–99)
Potassium: 4.6 mmol/L (ref 3.5–5.2)
Sodium: 139 mmol/L (ref 134–144)
Total Protein: 7.6 g/dL (ref 6.0–8.5)
eGFR: 84 mL/min/{1.73_m2} (ref >59)

## 2021-02-05 LAB — HEMOGLOBIN A1C
Est. average glucose Bld gHb Est-mCnc: 105 mg/dL
Hgb A1c MFr Bld: 5.3 % (ref 4.8–5.6)

## 2021-02-05 LAB — TSH: TSH: 0.963 u[IU]/mL (ref 0.450–4.500)

## 2021-02-05 LAB — PSA: Prostate Specific Ag, Serum: 1.1 ng/mL (ref 0.0–4.0)

## 2021-02-18 ENCOUNTER — Other Ambulatory Visit: Payer: Self-pay

## 2021-02-18 ENCOUNTER — Ambulatory Visit: Payer: BC Managed Care – PPO | Admitting: Gastroenterology

## 2021-02-18 VITALS — BP 135/94 | HR 74 | Temp 98.1°F | Ht 72.0 in | Wt 204.0 lb

## 2021-02-18 DIAGNOSIS — K219 Gastro-esophageal reflux disease without esophagitis: Secondary | ICD-10-CM

## 2021-02-18 DIAGNOSIS — R7989 Other specified abnormal findings of blood chemistry: Secondary | ICD-10-CM

## 2021-02-18 DIAGNOSIS — R140 Abdominal distension (gaseous): Secondary | ICD-10-CM

## 2021-02-18 NOTE — Progress Notes (Signed)
Melodie Bouillon, MD [address]  [address]  [place], [place] [address]  Main: [phone]  Fax: [phone]   Primary Care Physician: Maple Hudson., MD   Chief complaint: Follow-up for abdominal bloating  HPI: Shawn Vasquez is a 55 y.o. male here for follow-up of abdominal distention and bloating.  Since last visit patient has intentionally lost weight by eating a healthy diet, avoiding processed foods and soft drinks.  With this he states his abdominal symptoms are much better.  He continues to drink miracle tea daily and reports 1 soft bowel movement daily with no blood.  No nausea or vomiting.  Good appetite.  He reports being on Prilosec daily for at least 10 to 15 years for heartburn.  He states heartburn is better as well.  Does report that abdominal distention and bloating is better, but feels intermittent passing gas-like sensation in his epigastric region and bilateral lower quadrant intermittently.  He usually feels this when he is on his tractor.  His PCP discontinued his PPI and patient is on Pepcid twice a day at this time.  PCP was able to get his CT scan done which was reassuring   ROS: All ROS reviewed and negative except as per HPI   Past Medical History:  Diagnosis Date   Prostate infection 1990's    Past Surgical History:  Procedure Laterality Date   ACHILLES TENDON REPAIR     HERNIA REPAIR     inguinal and umbilical   OTHER SURGICAL HISTORY     bronchial cleft cyst removed   TONSILLECTOMY AND ADENOIDECTOMY      Prior to Admission medications   Medication Sig Start Date End Date Taking? Authorizing Provider  ALPRAZolam Prudy Feeler) 0.5 MG tablet Take 1 tablet (0.5 mg total) by mouth at bedtime as needed for anxiety. 07/23/18  Yes Maple Hudson., MD  cetirizine (ZYRTEC) 10 MG tablet Take by mouth. 03/07/12  Yes [provider]  cyclobenzaprine (FLEXERIL) 10 MG tablet Take 1 tablet (10 mg total) by mouth at bedtime.  09/08/20  Yes Maple Hudson., MD  fluticasone [facility]) 50 MCG/ACT nasal spray SHAKE LIQUID AND USE 1 TO 2 SPRAYS IN EACH NOSTRIL DAILY AS NEEDED 02/02/21  Yes Maple Hudson., MD  omeprazole (PRILOSEC) 20 MG capsule TAKE 1 CAPSULE(20 MG) BY MOUTH EVERY MORNING Patient not taking: Reported on 02/18/2021 11/03/20   Maple Hudson., MD    Family History  Problem Relation Age of Onset   Heart attack Father    Seizures Father    Lung cancer Father    Prostate cancer Maternal Uncle    Dementia Maternal Grandmother    Colon cancer Paternal Grandmother    Skin cancer Paternal Grandfather      Social History   Tobacco Use   Smoking status: Never   Smokeless tobacco: Never  Vaping Use   Vaping Use: Never used  Substance Use Topics   Alcohol use: Yes    Comment: occasionally   Drug use: No    Allergies as of 02/18/2021   (No Known Allergies)    Physical Examination:  Constitutional: General:   Alert,  Well-developed, well-nourished, pleasant and cooperative in NAD BP (!) 135/94   Pulse 74   Temp 98.1 F (36.7 C) (Oral)   Ht 6' (1.829 m)   Wt 204 lb (92.5 kg)   BMI 27.67 kg/m   Respiratory: Normal respiratory effort  Gastrointestinal:  Soft, non-tender and  non-distended without masses, hepatosplenomegaly or hernias noted.  No guarding or rebound tenderness.     Cardiac: No clubbing or edema.  No cyanosis. Normal posterior tibial pedal pulses noted.  Psych:  Alert and cooperative. Normal mood and affect.  Musculoskeletal:  Normal gait. Head normocephalic, atraumatic. Symmetrical without gross deformities. 5/5 Lower extremity strength bilaterally.  Skin: Warm. Intact without significant lesions or rashes. No jaundice.  Neck: Supple, trachea midline  Lymph: No cervical lymphadenopathy  Psych:  Alert and oriented x3, Alert and cooperative. Normal mood and affect.  Labs: CMP     Component Value Date/Time   NA 139 02/04/2021 1459   K 4.6 02/04/2021  1459   CL 99 02/04/2021 1459   CO2 24 02/04/2021 1459   GLUCOSE 89 02/04/2021 1459   GLUCOSE 102 (H) 06/08/2017 0939   BUN 13 02/04/2021 1459   CREATININE 1.05 02/04/2021 1459   CREATININE 0.94 06/08/2017 0939   CALCIUM 10.1 02/04/2021 1459   PROT 7.6 02/04/2021 1459   ALBUMIN 4.6 02/04/2021 1459   AST 22 02/04/2021 1459   ALT 26 02/04/2021 1459   ALKPHOS 55 02/04/2021 1459   BILITOT 0.6 02/04/2021 1459   GFRNONAA 79 12/25/2019 1135   GFRNONAA 94 06/08/2017 0939   GFRAA 91 12/25/2019 1135   GFRAA 109 06/08/2017 0939   Lab Results  Component Value Date   WBC 10.8 02/04/2021   HGB 16.9 02/04/2021   HCT 47.9 02/04/2021   MCV 90 02/04/2021   PLT 203 02/04/2021    Imaging Studies: CT abdomen pelvis with contrast June 2022, no acute abnormalities, no evidence of bowel obstruction, diverticulitis or appendicitis.  Normal appendix.  Normal pancreas, normal liver reported  Assessment and Plan:   Shawn Vasquez is a 55 y.o. y/o male here for follow-up of abdominal bloating and distention and fatty liver  Abdominal bloating and distention have improved significantly since patient's intentional weight loss and avoiding processed foods and eating healthy diet  The fleeting sensations he has in his abdomen, are likely from gas.  Patient advised to use Gas-X as needed  However, given his chronic GERD, we did discuss EGD.  However, patient would like to see if symptoms improve with his weight loss and diet changes prior to proceeding as he is questioning cost of the procedure, which I have advised him to look into with his insurance  However, he is willing to reconsider if symptoms continue and willing to follow-up in clinic in a few months to reassess as well  Patient educated extensively on acid reflux lifestyle modification, including buying a bed wedge, not eating 3 hrs before bedtime, diet modifications, and handout given for the same.   CT scan was otherwise reassuring as  above  With his weight loss, his fatty liver may have improved, given that CT is not reporting it and his liver enzymes have been normal  I will recheck anti-smooth muscle antibody and ferritin since they were elevated previously.  Hemochromatosis panel was normal  Dr Melodie Bouillon

## 2021-02-23 LAB — FERRITIN: Ferritin: 534 ng/mL — ABNORMAL HIGH (ref 30–400)

## 2021-02-23 LAB — ANTI-SMOOTH MUSCLE ANTIBODY, IGG: Smooth Muscle Ab: 43 Units — ABNORMAL HIGH (ref 0–19)

## 2021-02-24 ENCOUNTER — Encounter: Payer: Self-pay | Admitting: Gastroenterology

## 2021-02-24 NOTE — Addendum Note (Signed)
Addended by: Melodie Bouillon on: 02/24/2021 09:08 AM   Modules accepted: Orders

## 2021-02-25 DIAGNOSIS — R7989 Other specified abnormal findings of blood chemistry: Secondary | ICD-10-CM | POA: Diagnosis not present

## 2021-02-25 NOTE — Addendum Note (Signed)
Addended by: Roena Malady on: 02/25/2021 11:14 AM   Modules accepted: Orders

## 2021-03-02 LAB — ANCA TITERS
Atypical pANCA: <1:20 {titer}
C-ANCA: <1:20 {titer}
P-ANCA: <1:20 {titer}

## 2021-03-02 LAB — SOLUBLE LIVER AG (IGG AB): Anti-SLA, IgG: 0.5 units (ref 0.0–20.0)

## 2021-03-09 ENCOUNTER — Inpatient Hospital Stay: Payer: BC Managed Care – PPO

## 2021-03-09 ENCOUNTER — Inpatient Hospital Stay: Payer: BC Managed Care – PPO | Attending: Oncology | Admitting: Oncology

## 2021-03-09 ENCOUNTER — Encounter: Payer: Self-pay | Admitting: Oncology

## 2021-03-09 VITALS — BP 145/106 | HR 68 | Temp 99.3°F | Resp 18 | Wt 204.3 lb

## 2021-03-09 DIAGNOSIS — R7989 Other specified abnormal findings of blood chemistry: Secondary | ICD-10-CM | POA: Insufficient documentation

## 2021-03-09 DIAGNOSIS — Z79899 Other long term (current) drug therapy: Secondary | ICD-10-CM | POA: Insufficient documentation

## 2021-03-09 DIAGNOSIS — Z801 Family history of malignant neoplasm of trachea, bronchus and lung: Secondary | ICD-10-CM | POA: Insufficient documentation

## 2021-03-09 DIAGNOSIS — R109 Unspecified abdominal pain: Secondary | ICD-10-CM | POA: Insufficient documentation

## 2021-03-09 DIAGNOSIS — Z8042 Family history of malignant neoplasm of prostate: Secondary | ICD-10-CM | POA: Diagnosis not present

## 2021-03-09 NOTE — Progress Notes (Addendum)
Hematology/Oncology Consult note [facility] Telephone:([phone] [phone]   Patient Care Team: Maple Hudson., MD as PCP - General (Family Medicine)  REFERRING PROVIDER: Maple Hudson.,*  CHIEF COMPLAINTS/REASON FOR VISIT:  Evaluation of elevation of ferritin.  HISTORY OF PRESENTING ILLNESS:   Shawn Vasquez is a  55 y.o.  male with PMH listed below was seen in consultation at the request of  Ferdinando, Lodge.,*  for evaluation of elevated ferritin.  10/01/2020, US abdominal complete was obtained for work-up of chronic abdominal pain.  Ultrasound showed increased echogenicity of the liver.  Nonspecific findings most commonly seen fatty liver disease.  No obvious liver lesions 11/16/2020, iron panel showed TIBC 3 17, ferritin 545, iron saturation 37. 11/16/2020, mitochondrial antibody negative.  Positive anti-smooth muscle cell antibody 1:44 Negative ANA, negative ANCA.  Negative hepatitis panel, 12/03/2020, hemochromatosis panel was negative. Patient sees gastroenterology for evaluation of abdominal bloating.  Chronic acid reflux.  Currently on Pepcid twice daily. 01/11/2021 patient had a CT scan done through [facility].  No acute etiology.  No further liver disease was mentioned.  Images not available to me.  02/19/2021, repeat ferritin is persistently elevated at 534.  Repeat anti-smooth muscle cell antibody is persistently elevated 1:43  Patient reports abdominal bloating has improved.  He has intentionally lost weight recently. Denies any heavy alcohol use currently.  Occasionally he drinks alcohol during weekends. Denies any chronic wound, he has chronic discharge from a dental " abscess", denies any fever or chills.  He has some intermittent chronic tenderness of that area.  Review of Systems  Constitutional:  Negative for appetite change, chills, fatigue, fever and unexpected weight change.  HENT:   Negative for hearing loss  and voice change.   Eyes:  Negative for eye problems and icterus.  Respiratory:  Negative for chest tightness, cough and shortness of breath.   Cardiovascular:  Negative for chest pain and leg swelling.  Gastrointestinal:  Negative for abdominal distention and abdominal pain.  Endocrine: Negative for hot flashes.  Genitourinary:  Negative for difficulty urinating, dysuria and frequency.   Musculoskeletal:  Negative for arthralgias.  Skin:  Negative for itching and rash.  Neurological:  Negative for light-headedness and numbness.  Hematological:  Negative for adenopathy. Does not bruise/bleed easily.  Psychiatric/Behavioral:  Negative for confusion.    MEDICAL HISTORY:  Past Medical History:  Diagnosis Date   Prostate infection 1990's    SURGICAL HISTORY: Past Surgical History:  Procedure Laterality Date   ACHILLES TENDON REPAIR     HERNIA REPAIR     inguinal and umbilical   OTHER SURGICAL HISTORY     bronchial cleft cyst removed   TONSILLECTOMY AND ADENOIDECTOMY      SOCIAL HISTORY: Social History   Socioeconomic History   Marital status: Married    Spouse name: Not on file   Number of children: Not on file   Years of education: Not on file   Highest education level: Not on file  Occupational History   Not on file  Tobacco Use   Smoking status: Never   Smokeless tobacco: Never  Vaping Use   Vaping Use: Never used  Substance and Sexual Activity   Alcohol use: Yes    Comment: occasionally   Drug use: No   Sexual activity: Yes  Other Topics Concern   Not on file  Social History Narrative   Not on file   Social Determinants of Health   Financial  Resource Strain: Not on file  Food Insecurity: Not on file  Transportation Needs: Not on file  Physical Activity: Not on file  Stress: Not on file  Social Connections: Not on file  Intimate Partner Violence: Not on file    FAMILY HISTORY: Family History  Problem Relation Age of Onset   Heart attack Father     Seizures Father    Lung cancer Father    Prostate cancer Maternal Uncle    Dementia Maternal Grandmother    Colon cancer Paternal Grandmother    Skin cancer Paternal Grandfather     ALLERGIES:  has No Known Allergies.  MEDICATIONS:  Current Outpatient Medications  Medication Sig Dispense Refill   fluticasone (FLONASE) 50 MCG/ACT nasal spray SHAKE LIQUID AND USE 1 TO 2 SPRAYS IN EACH NOSTRIL DAILY AS NEEDED 48 g 2   omeprazole (PRILOSEC) 20 MG capsule TAKE 1 CAPSULE(20 MG) BY MOUTH EVERY MORNING 90 capsule 1   ALPRAZolam (XANAX) 0.5 MG tablet Take 1 tablet (0.5 mg total) by mouth at bedtime as needed for anxiety. (Patient not taking: Reported on 03/09/2021) 30 tablet 2   cetirizine (ZYRTEC) 10 MG tablet Take by mouth.     cyclobenzaprine (FLEXERIL) 10 MG tablet Take 1 tablet (10 mg total) by mouth at bedtime. (Patient not taking: Reported on 03/09/2021) 30 tablet 0   No current facility-administered medications for this visit.     PHYSICAL EXAMINATION: ECOG PERFORMANCE STATUS: 0 - Asymptomatic Vitals:   03/09/21 1129 03/09/21 1136  BP: (!) 152/96 (!) 145/106  Pulse: 68   Resp: 18   Temp: 99.3 F (37.4 C)   SpO2: 99%    Filed Weights   03/09/21 1129  Weight: 204 lb 5 oz (92.7 kg)    Physical Exam Constitutional:      General: He is not in acute distress. HENT:     Head: Normocephalic and atraumatic.  Eyes:     General: No scleral icterus. Cardiovascular:     Rate and Rhythm: Normal rate and regular rhythm.     Heart sounds: Normal heart sounds.  Pulmonary:     Effort: Pulmonary effort is normal. No respiratory distress.     Breath sounds: No wheezing.  Abdominal:     General: Bowel sounds are normal. There is no distension.     Palpations: Abdomen is soft.  Musculoskeletal:        General: No deformity. Normal range of motion.     Cervical back: Normal range of motion and neck supple.  Skin:    General: Skin is warm and dry.     Findings: No erythema or rash.   Neurological:     Mental Status: He is alert and oriented to person, place, and time. Mental status is at baseline.     Cranial Nerves: No cranial nerve deficit.     Coordination: Coordination normal.  Psychiatric:        Mood and Affect: Mood normal.    LABORATORY DATA:  I have reviewed the data as listed Lab Results  Component Value Date   WBC 10.8 02/04/2021   HGB 16.9 02/04/2021   HCT 47.9 02/04/2021   MCV 90 02/04/2021   PLT 203 02/04/2021   Recent Labs    11/16/20 1540 02/04/21 1459  NA  --  139  K  --  4.6  CL  --  99  CO2  --  24  GLUCOSE  --  89  BUN  --  13  CREATININE  --  1.05  CALCIUM  --  10.1  PROT 7.9 7.6  ALBUMIN 4.9 4.6  AST 26 22  ALT 35 26  ALKPHOS 54 55  BILITOT 0.3 0.6  BILIDIR 0.11  --    Iron/TIBC/Ferritin/ %Sat    Component Value Date/Time   IRON 118 11/16/2020 1540   TIBC 317 11/16/2020 1540   FERRITIN 534 (H) 02/18/2021 1537   IRONPCTSAT 37 11/16/2020 1540      RADIOGRAPHIC STUDIES: I have personally reviewed the radiological images as listed and agreed with the findings in the report. No results found.    ASSESSMENT & PLAN:  1. Elevated ferritin    #Hemochromatosis panel was negative for hereditary hemochromatosis genes.  Patient has no previous history of blood transfusion.  Isolated increase of ferritin in the 500s. Probably reactive to chronic inflammation, underlying autoimmune disease Patient has persistent antismooth muscle  antibody, but no transaminitis.  We will touch base with gastroenterology to see if he meets criteria of autoimmune hepatitis.  Chronic inflammation secondary to his dental issue probably attribute to the elevated ferritin. Currently he has a normal TSAT, ferritin is just above 500.  I will hold off phlebotomy.  Recommend patient to see dentist and have dental issue taking care of.  Orders Placed This Encounter  Procedures   CBC with Differential/Platelet    Standing Status:   Future     Standing Expiration Date:   03/09/2022   Comprehensive metabolic panel    Standing Status:   Future    Standing Expiration Date:   03/09/2022   Ferritin    Standing Status:   Future    Standing Expiration Date:   03/09/2022   Iron and TIBC    Standing Status:   Future    Standing Expiration Date:   03/09/2022    All questions were answered. The patient knows to call the clinic with any problems questions or concerns.   Maple Hudson.,*    Return of visit: 6 months Thank you for this kind referral and the opportunity to participate in the care of this patient. A copy of today's note is routed to referring provider    Rickard Patience, MD, PhD Hematology Oncology [facility]  03/09/2021

## 2021-03-12 NOTE — Addendum Note (Signed)
Addended by: Melodie Bouillon on: 03/12/2021 04:30 PM   Modules accepted: Orders

## 2021-03-15 ENCOUNTER — Other Ambulatory Visit: Payer: Self-pay

## 2021-03-15 DIAGNOSIS — R7989 Other specified abnormal findings of blood chemistry: Secondary | ICD-10-CM

## 2021-08-04 DIAGNOSIS — R7989 Other specified abnormal findings of blood chemistry: Secondary | ICD-10-CM | POA: Diagnosis not present

## 2021-08-06 LAB — ANTI-SMOOTH MUSCLE ANTIBODY, IGG: Smooth Muscle Ab: 55 Units — ABNORMAL HIGH (ref 0–19)

## 2021-09-02 ENCOUNTER — Other Ambulatory Visit: Payer: Self-pay | Admitting: *Deleted

## 2021-09-02 DIAGNOSIS — R7989 Other specified abnormal findings of blood chemistry: Secondary | ICD-10-CM

## 2021-09-03 ENCOUNTER — Telehealth: Payer: Self-pay | Admitting: Oncology

## 2021-09-03 NOTE — Telephone Encounter (Signed)
Spoke to pt, he wanted to cxl appts and will reschedule at a later date. He will call back.

## 2021-09-03 NOTE — Telephone Encounter (Signed)
Pt called to reschedule his appts for 2-8 & 2-10.He will call bacl to reschedule.

## 2021-09-08 ENCOUNTER — Inpatient Hospital Stay: Payer: BC Managed Care – PPO

## 2021-09-10 ENCOUNTER — Ambulatory Visit: Payer: BC Managed Care – PPO | Admitting: Oncology

## 2021-12-23 DIAGNOSIS — D2272 Melanocytic nevi of left lower limb, including hip: Secondary | ICD-10-CM | POA: Diagnosis not present

## 2021-12-23 DIAGNOSIS — D225 Melanocytic nevi of trunk: Secondary | ICD-10-CM | POA: Diagnosis not present

## 2021-12-23 DIAGNOSIS — L570 Actinic keratosis: Secondary | ICD-10-CM | POA: Diagnosis not present

## 2021-12-23 DIAGNOSIS — D2262 Melanocytic nevi of left upper limb, including shoulder: Secondary | ICD-10-CM | POA: Diagnosis not present

## 2021-12-23 DIAGNOSIS — X32XXXA Exposure to sunlight, initial encounter: Secondary | ICD-10-CM | POA: Diagnosis not present

## 2021-12-23 DIAGNOSIS — D2261 Melanocytic nevi of right upper limb, including shoulder: Secondary | ICD-10-CM | POA: Diagnosis not present

## 2022-02-08 ENCOUNTER — Ambulatory Visit (INDEPENDENT_AMBULATORY_CARE_PROVIDER_SITE_OTHER): Payer: BC Managed Care – PPO | Admitting: Family Medicine

## 2022-02-08 ENCOUNTER — Encounter: Payer: Self-pay | Admitting: Family Medicine

## 2022-02-08 VITALS — BP 129/83 | HR 70 | Temp 97.9°F | Resp 16 | Ht 72.0 in | Wt 211.8 lb

## 2022-02-08 DIAGNOSIS — Z1389 Encounter for screening for other disorder: Secondary | ICD-10-CM | POA: Diagnosis not present

## 2022-02-08 DIAGNOSIS — Z0000 Encounter for general adult medical examination without abnormal findings: Secondary | ICD-10-CM | POA: Diagnosis not present

## 2022-02-08 DIAGNOSIS — J309 Allergic rhinitis, unspecified: Secondary | ICD-10-CM

## 2022-02-08 DIAGNOSIS — R739 Hyperglycemia, unspecified: Secondary | ICD-10-CM

## 2022-02-08 DIAGNOSIS — K219 Gastro-esophageal reflux disease without esophagitis: Secondary | ICD-10-CM

## 2022-02-08 DIAGNOSIS — E6609 Other obesity due to excess calories: Secondary | ICD-10-CM

## 2022-02-08 DIAGNOSIS — Z125 Encounter for screening for malignant neoplasm of prostate: Secondary | ICD-10-CM | POA: Diagnosis not present

## 2022-02-08 DIAGNOSIS — Z6830 Body mass index (BMI) 30.0-30.9, adult: Secondary | ICD-10-CM

## 2022-02-08 DIAGNOSIS — Z1211 Encounter for screening for malignant neoplasm of colon: Secondary | ICD-10-CM | POA: Diagnosis not present

## 2022-02-08 LAB — POCT URINALYSIS DIPSTICK
Bilirubin, UA: NEGATIVE
Glucose, UA: NEGATIVE
Ketones, UA: NEGATIVE
Nitrite, UA: NEGATIVE
Protein, UA: NEGATIVE
Spec Grav, UA: 1.01 (ref 1.010–1.025)
Urobilinogen, UA: 0.2 E.U./dL
pH, UA: 6 (ref 5.0–8.0)

## 2022-02-08 LAB — IFOBT (OCCULT BLOOD): IFOBT: NEGATIVE

## 2022-02-09 LAB — COMPREHENSIVE METABOLIC PANEL
ALT: 25 IU/L (ref 0–44)
AST: 20 IU/L (ref 0–40)
Albumin/Globulin Ratio: 1.6 (ref 1.2–2.2)
Albumin: 4.5 g/dL (ref 3.8–4.9)
Alkaline Phosphatase: 54 IU/L (ref 44–121)
BUN/Creatinine Ratio: 10 (ref 9–20)
BUN: 10 mg/dL (ref 6–24)
Bilirubin Total: 0.5 mg/dL (ref 0.0–1.2)
CO2: 25 mmol/L (ref 20–29)
Calcium: 10 mg/dL (ref 8.7–10.2)
Chloride: 101 mmol/L (ref 96–106)
Creatinine, Ser: 0.98 mg/dL (ref 0.76–1.27)
Globulin, Total: 2.9 g/dL (ref 1.5–4.5)
Glucose: 101 mg/dL — ABNORMAL HIGH (ref 70–99)
Potassium: 4.6 mmol/L (ref 3.5–5.2)
Sodium: 140 mmol/L (ref 134–144)
Total Protein: 7.4 g/dL (ref 6.0–8.5)
eGFR: 91 mL/min/{1.73_m2} (ref >59)

## 2022-02-09 LAB — LIPID PANEL
Chol/HDL Ratio: 3.7 ratio (ref 0.0–5.0)
Cholesterol, Total: 170 mg/dL (ref 100–199)
HDL: 46 mg/dL (ref >39)
LDL Chol Calc (NIH): 106 mg/dL — ABNORMAL HIGH (ref 0–99)
Triglycerides: 96 mg/dL (ref 0–149)
VLDL Cholesterol Cal: 18 mg/dL (ref 5–40)

## 2022-02-09 LAB — CBC WITH DIFFERENTIAL/PLATELET
Basophils Absolute: 0.1 10*3/uL (ref 0.0–0.2)
Basos: 1 %
EOS (ABSOLUTE): 0.5 10*3/uL — ABNORMAL HIGH (ref 0.0–0.4)
Eos: 6 %
Hematocrit: 47.7 % (ref 37.5–51.0)
Hemoglobin: 16.9 g/dL (ref 13.0–17.7)
Immature Grans (Abs): 0 10*3/uL (ref 0.0–0.1)
Immature Granulocytes: 1 %
Lymphocytes Absolute: 1.6 10*3/uL (ref 0.7–3.1)
Lymphs: 21 %
MCH: 32.1 pg (ref 26.6–33.0)
MCHC: 35.4 g/dL (ref 31.5–35.7)
MCV: 91 fL (ref 79–97)
Monocytes Absolute: 0.7 10*3/uL (ref 0.1–0.9)
Monocytes: 9 %
Neutrophils Absolute: 4.8 10*3/uL (ref 1.4–7.0)
Neutrophils: 62 %
Platelets: 215 10*3/uL (ref 150–450)
RBC: 5.26 x10E6/uL (ref 4.14–5.80)
RDW: 12.7 % (ref 11.6–15.4)
WBC: 7.7 10*3/uL (ref 3.4–10.8)

## 2022-02-09 LAB — TSH: TSH: 2.37 u[IU]/mL (ref 0.450–4.500)

## 2022-02-09 LAB — PSA: Prostate Specific Ag, Serum: 0.9 ng/mL (ref 0.0–4.0)

## 2022-02-09 NOTE — Progress Notes (Signed)
I,Shawn Vasquez,acting as a scribe for Shawn Durie, MD.,have documented all relevant documentation on the behalf of Shawn Packman, MD,as directed by  Shawn Durie, MD while in the presence of Shawn Durie, MD.   Complete physical exam   Patient: Shawn Vasquez   DOB: 09-30-1965   56 y.o. Male  MRN: [id number] Visit Date: 02/08/2022  Today's healthcare provider: [name], MD   Chief Complaint  Patient presents with   Annual Exam   Subjective    Shawn Vasquez is a 57 y.o. male who presents today for a complete physical exam.  He reports consuming a general diet.  Patient is active with farming.   He generally feels well. He reports sleeping well. He does not have additional problems to discuss today.  He has been working on dietary changes and weight loss and has been off omeprazole for 1 year and feels well.  Past Medical History:  Diagnosis Date   Prostate infection 1990's   Past Surgical History:  Procedure Laterality Date   ACHILLES TENDON REPAIR     HERNIA REPAIR     inguinal and umbilical   OTHER SURGICAL HISTORY     bronchial cleft cyst removed   TONSILLECTOMY AND ADENOIDECTOMY     Social History   Socioeconomic History   Marital status: Married    Spouse name: Not on file   Number of children: Not on file   Years of education: Not on file   Highest education level: Not on file  Occupational History   Not on file  Tobacco Use   Smoking status: Never   Smokeless tobacco: Never  Vaping Use   Vaping Use: Never used  Substance and Sexual Activity   Alcohol use: Yes    Comment: occasionally   Drug use: No   Sexual activity: Yes  Other Topics Concern   Not on file  Social History Narrative   Not on file   Social Determinants of Health   Financial Resource Strain: Not on file  Food Insecurity: Not on file  Transportation Needs: Not on file  Physical Activity: Not on file  Stress: Not on file  Social Connections: Not  on file  Intimate Partner Violence: Not on file   Family Status  Relation Name Status   Mother  Alive   Father  Deceased   Sister  Alive   Mat Uncle  (Not Specified)   MGM  (Not Specified)   PGM  (Not Specified)   PGF  (Not Specified)   Family History  Problem Relation Age of Onset   Heart attack Father    Seizures Father    Lung cancer Father    Prostate cancer Maternal Uncle    Dementia Maternal Grandmother    Colon cancer Paternal Grandmother    Skin cancer Paternal Grandfather    No Known Allergies  Patient Care Team: Jerrol Banana., MD as PCP - General (Family Medicine)   Medications: Outpatient Medications Prior to Visit  Medication Sig   cetirizine (ZYRTEC) 10 MG tablet Take by mouth.   fluticasone (FLONASE) 50 MCG/ACT nasal spray SHAKE LIQUID AND USE 1 TO 2 SPRAYS IN EACH NOSTRIL DAILY AS NEEDED   ALPRAZolam (XANAX) 0.5 MG tablet Take 1 tablet (0.5 mg total) by mouth at bedtime as needed for anxiety. (Patient not taking: Reported on 02/08/2022)   [DISCONTINUED] cyclobenzaprine (FLEXERIL) 10 MG tablet Take 1 tablet (10 mg total) by mouth at bedtime. (  Patient not taking: Reported on 02/08/2022)   [DISCONTINUED] omeprazole (PRILOSEC) 20 MG capsule TAKE 1 CAPSULE(20 MG) BY MOUTH EVERY MORNING   No facility-administered medications prior to visit.    Review of Systems  Constitutional: Negative.   HENT:  Positive for congestion.   Eyes: Negative.   Respiratory: Negative.    Cardiovascular: Negative.   Gastrointestinal: Negative.   Endocrine: Negative.   Genitourinary: Negative.   Musculoskeletal: Negative.   Skin: Negative.   Allergic/Immunologic: Negative.   Neurological: Negative.   Hematological: Negative.   Psychiatric/Behavioral: Negative.    All other systems reviewed and are negative.   Last lipids Lab Results  Component Value Date   CHOL 170 02/08/2022   HDL 46 02/08/2022   LDLCALC 106 (H) 02/08/2022   TRIG 96 02/08/2022   CHOLHDL 3.7  02/08/2022      Objective     BP 129/83 (BP Location: Right Arm, Patient Position: Sitting, Cuff Size: Normal)   Pulse 70   Temp 97.9 F (36.6 C) (Oral)   Resp 16   Ht 6' (1.829 m)   Wt 211 lb 12.8 oz (96.1 kg)   SpO2 100%   BMI 28.73 kg/m  BP Readings from Last 3 Encounters:  02/08/22 129/83  03/09/21 (!) 145/106  02/18/21 (!) 135/94   Wt Readings from Last 3 Encounters:  02/08/22 211 lb 12.8 oz (96.1 kg)  03/09/21 204 lb 5 oz (92.7 kg)  02/18/21 204 lb (92.5 kg)       Physical Exam Constitutional:      Appearance: Normal appearance. He is normal weight.  HENT:     Head: Normocephalic and atraumatic.     Right Ear: Tympanic membrane, ear canal and external ear normal.     Left Ear: Tympanic membrane, ear canal and external ear normal.     Nose: Nose normal.     Mouth/Throat:     Mouth: Mucous membranes are moist.     Pharynx: Oropharynx is clear.  Eyes:     Extraocular Movements: Extraocular movements intact.     Conjunctiva/sclera: Conjunctivae normal.     Pupils: Pupils are equal, round, and reactive to light.  Cardiovascular:     Rate and Rhythm: Normal rate and regular rhythm.     Pulses: Normal pulses.     Heart sounds: Normal heart sounds.  Pulmonary:     Effort: Pulmonary effort is normal.     Breath sounds: Normal breath sounds.  Abdominal:     General: Abdomen is flat. Bowel sounds are normal.     Palpations: Abdomen is soft.  Genitourinary:    Penis: Normal.      Testes: Normal.     Prostate: Normal.     Rectum: Normal.  Musculoskeletal:     Cervical back: Normal range of motion and neck supple.  Skin:    General: Skin is warm and dry.  Neurological:     General: No focal deficit present.     Mental Status: He is alert and oriented to person, place, and time. Mental status is at baseline.  Psychiatric:        Mood and Affect: Mood normal.        Behavior: Behavior normal.        Thought Content: Thought content normal.        Judgment:  Judgment normal.       Last depression screening scores    02/08/2022    9:10 AM 02/04/2021    2:23 PM 12/25/2019  10:32 AM  PHQ 2/9 Scores  PHQ - 2 Score 0 0 0  PHQ- 9 Score 0 0 0   Last fall risk screening    02/08/2022    9:10 AM  [place] in the past year? 0  Number falls in past yr: 0  Injury with Fall? 0  Follow up Falls evaluation completed   Last Audit-C alcohol use screening    02/08/2022    9:10 AM  Alcohol Use Disorder Test (AUDIT)  1. How often do you have a drink containing alcohol? 2  2. How many drinks containing alcohol do you have on a typical day when you are drinking? 0  3. How often do you have six or more drinks on one occasion? 0  AUDIT-C Score 2   A score of 3 or more in women, and 4 or more in men indicates increased risk for alcohol abuse, EXCEPT if all of the points are from question 1   Results for orders placed or performed in visit on 02/08/22  Lipid panel  Result Value Ref Range   Cholesterol, Total 170 100 - 199 mg/dL   Triglycerides 96 0 - 149 mg/dL   HDL 46 >39 mg/dL   VLDL Cholesterol Cal 18 5 - 40 mg/dL   LDL Chol Calc (NIH) 106 (H) 0 - 99 mg/dL   Chol/HDL Ratio 3.7 0.0 - 5.0 ratio  TSH  Result Value Ref Range   TSH 2.370 0.450 - 4.500 uIU/mL  CBC w/Diff/Platelet  Result Value Ref Range   WBC 7.7 3.4 - 10.8 x10E3/uL   RBC 5.26 4.14 - 5.80 x10E6/uL   Hemoglobin 16.9 13.0 - 17.7 g/dL   Hematocrit 47.7 37.5 - 51.0 %   MCV 91 79 - 97 fL   MCH 32.1 26.6 - 33.0 pg   MCHC 35.4 31.5 - 35.7 g/dL   RDW 12.7 11.6 - 15.4 %   Platelets 215 150 - 450 x10E3/uL   Neutrophils 62 Not Estab. %   Lymphs 21 Not Estab. %   Monocytes 9 Not Estab. %   Eos 6 Not Estab. %   Basos 1 Not Estab. %   Neutrophils Absolute 4.8 1.4 - 7.0 x10E3/uL   Lymphocytes Absolute 1.6 0.7 - 3.1 x10E3/uL   Monocytes Absolute 0.7 0.1 - 0.9 x10E3/uL   EOS (ABSOLUTE) 0.5 (H) 0.0 - 0.4 x10E3/uL   Basophils Absolute 0.1 0.0 - 0.2 x10E3/uL   Immature  Granulocytes 1 Not Estab. %   Immature Grans (Abs) 0.0 0.0 - 0.1 x10E3/uL  Comprehensive Metabolic Panel (CMET)  Result Value Ref Range   Glucose 101 (H) 70 - 99 mg/dL   BUN 10 6 - 24 mg/dL   Creatinine, Ser 0.98 0.76 - 1.27 mg/dL   eGFR 91 >59 mL/min/1.73   BUN/Creatinine Ratio 10 9 - 20   Sodium 140 134 - 144 mmol/L   Potassium 4.6 3.5 - 5.2 mmol/L   Chloride 101 96 - 106 mmol/L   CO2 25 20 - 29 mmol/L   Calcium 10.0 8.7 - 10.2 mg/dL   Total Protein 7.4 6.0 - 8.5 g/dL   Albumin 4.5 3.8 - 4.9 g/dL   Globulin, Total 2.9 1.5 - 4.5 g/dL   Albumin/Globulin Ratio 1.6 1.2 - 2.2   Bilirubin Total 0.5 0.0 - 1.2 mg/dL   Alkaline Phosphatase 54 44 - 121 IU/L   AST 20 0 - 40 IU/L   ALT 25 0 - 44 IU/L  PSA  Result  Value Ref Range   Prostate Specific Ag, Serum 0.9 0.0 - 4.0 ng/mL  POCT urinalysis dipstick  Result Value Ref Range   Color, UA Yellow    Clarity, UA Clear    Glucose, UA Negative Negative   Bilirubin, UA Negative    Ketones, UA Negative    Spec Grav, UA 1.010 1.010 - 1.025   Blood, UA Trace    pH, UA 6.0 5.0 - 8.0   Protein, UA Negative Negative   Urobilinogen, UA 0.2 0.2 or 1.0 E.U./dL   Nitrite, UA Negative    Leukocytes, UA Small (1+) (A) Negative  IFOBT POC (occult bld, rslt in office)  Result Value Ref Range   IFOBT Negative     Assessment & Plan    Routine Health Maintenance and Physical Exam  Exercise Activities and Dietary recommendations  Goals   None    Immunization History  Administered Date(s) Administered   Tdap 05/30/2014    Health Maintenance  Topic Date Due   HIV Screening  Never done   COVID-19 Vaccine (1) 02/10/2023 (Originally 02/01/1966)   Zoster Vaccines- Shingrix (1 of 2) 02/10/2023 (Originally 08/03/2016)   INFLUENZA VACCINE  06/13/2024 (Originally 03/01/2022)   TETANUS/TDAP  05/30/2024   COLONOSCOPY (Pts 45-48yr Insurance coverage will need to be confirmed)  10/18/2027   Hepatitis C Screening  Completed   HPV VACCINES  Aged Out     Discussed health benefits of physical activity, and encouraged him to engage in regular exercise appropriate for his age and condition.  1. Annual physical exam Follow-up 1 year - Lipid panel - TSH - CBC w/Diff/Platelet - Comprehensive Metabolic Panel (CMET)  2. Hyperglycemia  - Lipid panel - TSH - CBC w/Diff/Platelet - Comprehensive Metabolic Panel (CMET)  3. Allergic rhinitis, unspecified seasonality, unspecified trigger  - Lipid panel - TSH - CBC w/Diff/Platelet - Comprehensive Metabolic Panel (CMET)  4. Gastro-esophageal reflux disease without esophagitis Off of omeprazole and asymptomatic - Lipid panel - TSH - CBC w/Diff/Platelet - Comprehensive Metabolic Panel (CMET)  5. Class 1 obesity due to excess calories without serious comorbidity with body mass index (BMI) of 30.0 to 30.9 in adult  - Lipid panel - TSH - CBC w/Diff/Platelet - Comprehensive Metabolic Panel (CMET)  6. Prostate cancer screening  - PSA  7. Screening for blood or protein in urine  - POCT urinalysis dipstick  8. Encounter for screening fecal occult blood testing  - IFOBT POC (occult bld, rslt in office); Future   Return in about 1 year (around 02/09/2023).     I, RWilhemena Durie MD, have reviewed all documentation for this visit. The documentation on 02/09/22 for the exam, diagnosis, procedures, and orders are all accurate and complete.    Kaz GCranford Mon MD  [phone](phone) [phone](fax)  [facility]

## 2022-02-16 IMAGING — US US ABDOMEN COMPLETE
1 series · 14 of 25 positions shown · non-contrast
Comparison: None.

CLINICAL DATA: Chronic abdominal pain.

EXAM:
ABDOMEN ULTRASOUND COMPLETE

[Series 1: us abdomen complete · 0.20mm/px · 14 of 81 slices shown]
[im 1/81]
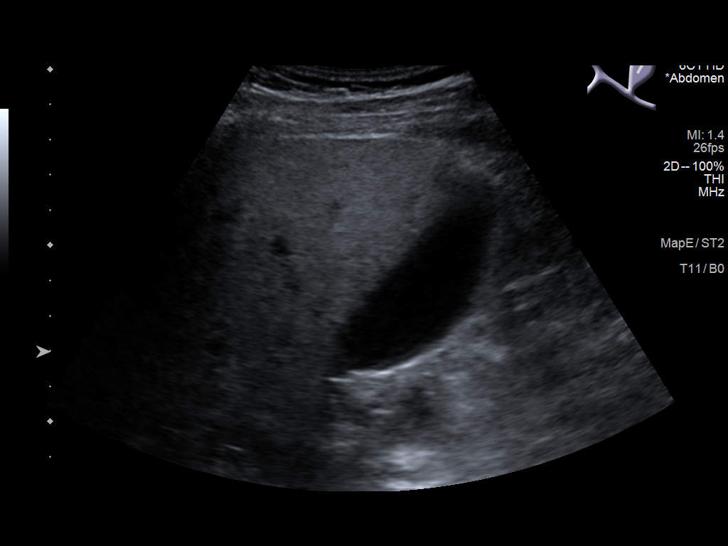
[im 7/81]
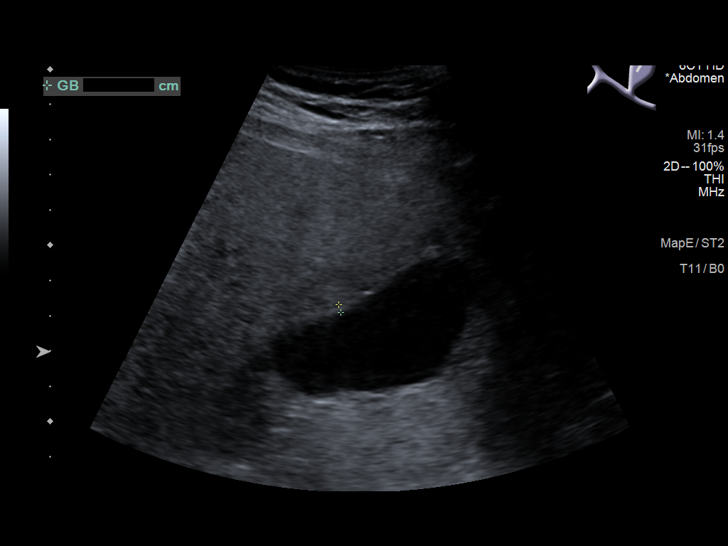
[im 14/81]
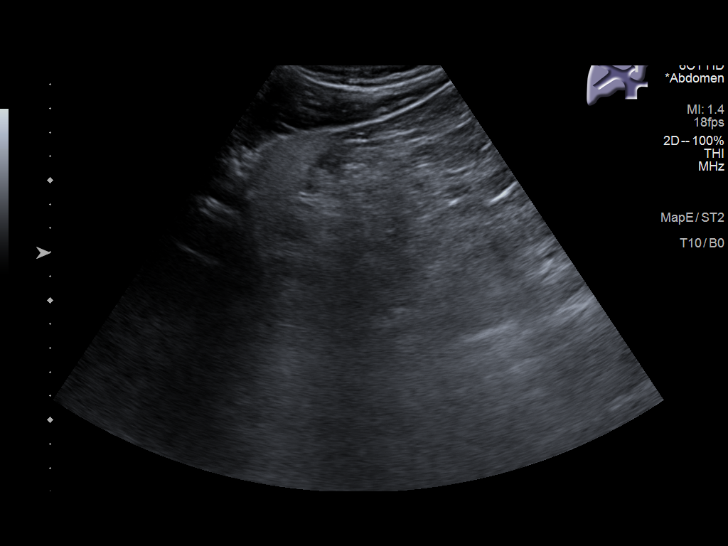
[im 21/81]
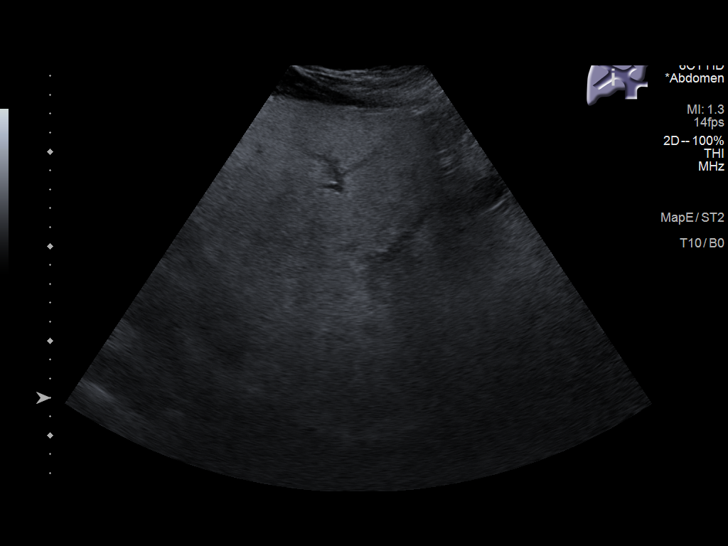
[im 27/81]
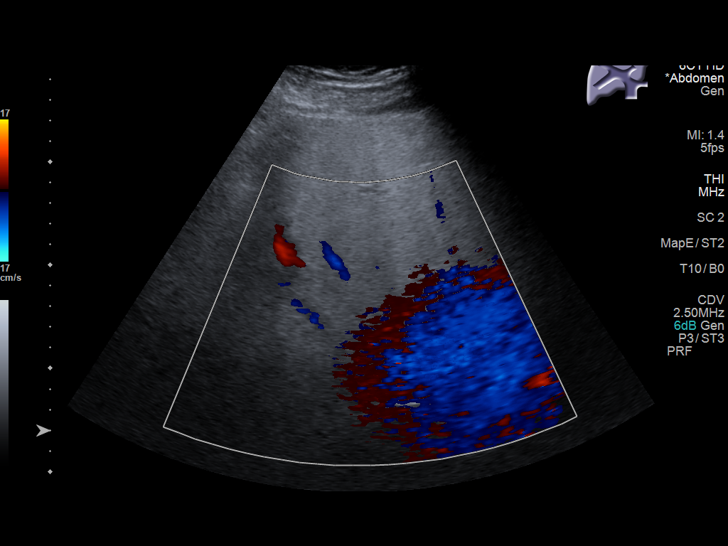
[im 31/81]
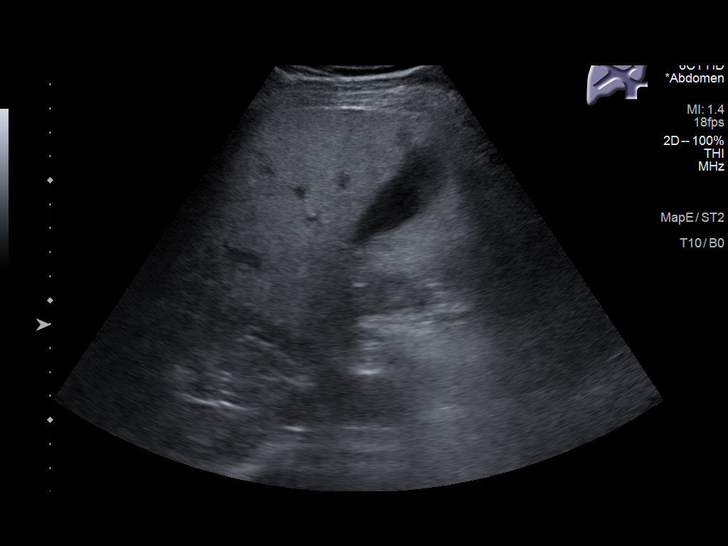
[im 37/81]
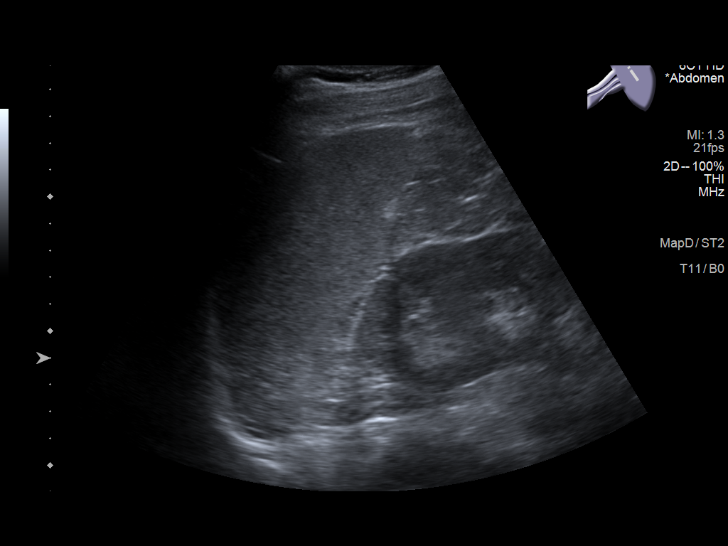
[im 44/81]
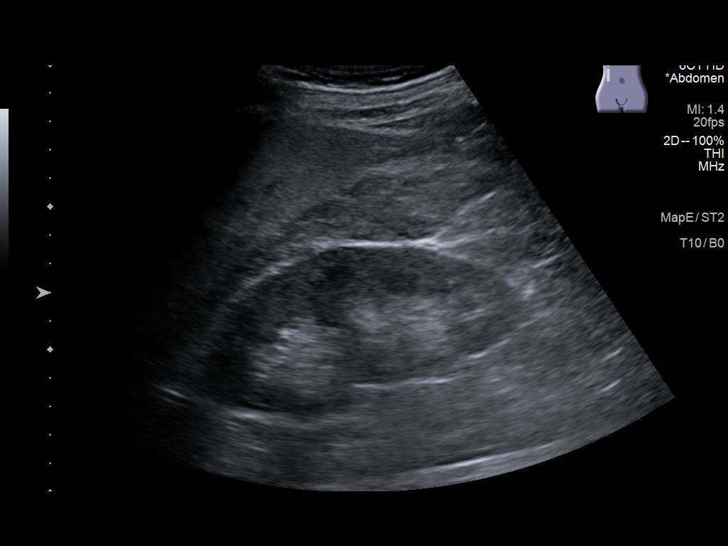
[im 51/81]
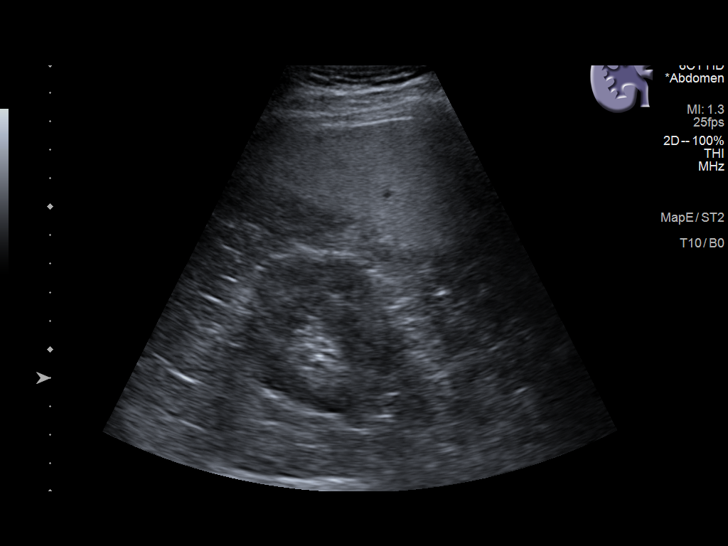
[im 54/81]
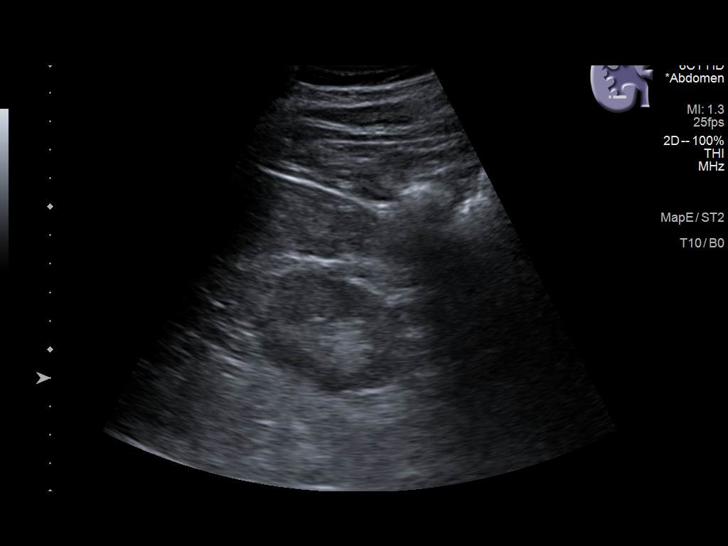
[im 61/81]
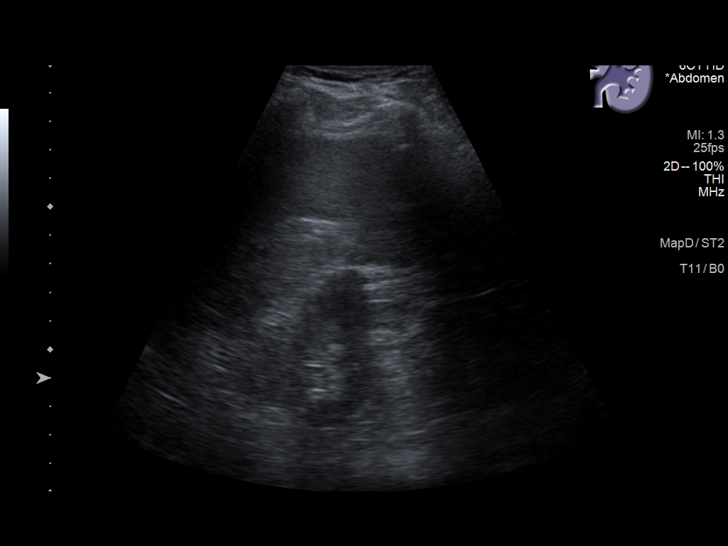
[im 67/81]
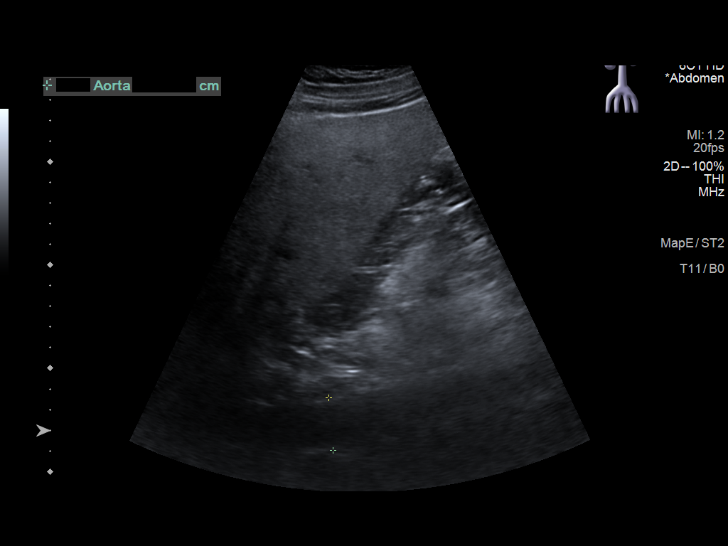
[im 74/81]
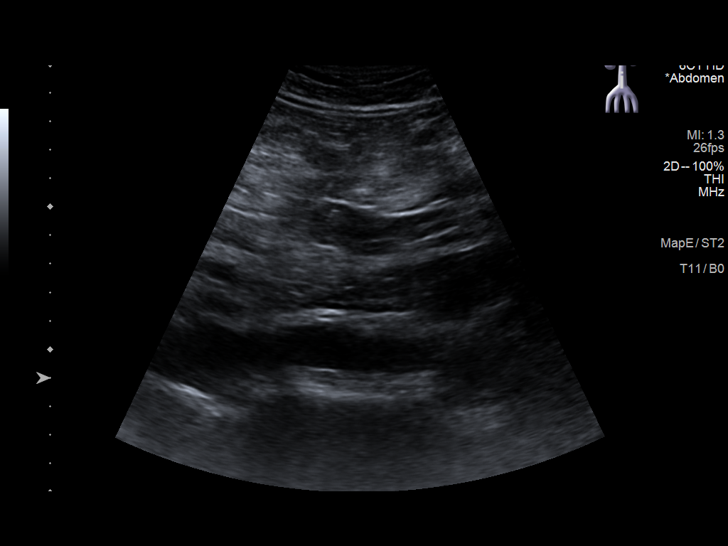
[im 81/81]
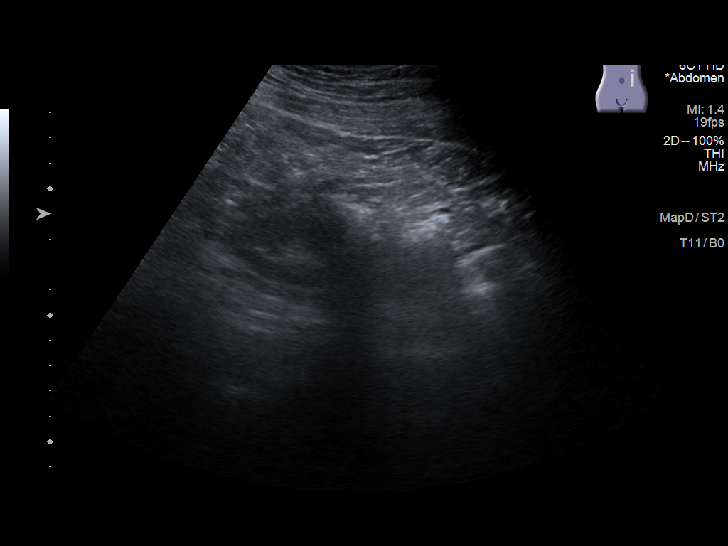

[14 of 25 positions shown; findings below may reference images not displayed]

FINDINGS: Gallbladder: No gallstones or wall thickening visualized. No
sonographic Murphy sign noted by sonographer.

Common bile duct: Diameter: 3 mm.

Liver: No focal lesion identified. Diffusely increased parenchymal
echogenicity. Portal vein is patent on color Doppler imaging with
normal direction of blood flow towards the liver.

IVC: No abnormality visualized.

Pancreas: Obscured by overlying bowel gas.

Spleen: Size and appearance within normal limits.

Right Kidney: Length: 11.3 cm. Echogenicity within normal limits. No
mass or hydronephrosis visualized.

Left Kidney: Length: 12.5 cm. Echogenicity within normal limits. No
mass or hydronephrosis visualized.

Abdominal aorta: No aneurysm visualized.

Other findings: None.
IMPRESSION: The echogenicity of the liver is increased. This is a nonspecific
finding but is most commonly seen with fatty infiltration of the
liver. There are no obvious focal liver lesions

## 2022-06-10 DIAGNOSIS — D513 Other dietary vitamin B12 deficiency anemia: Secondary | ICD-10-CM | POA: Diagnosis not present

## 2022-06-10 DIAGNOSIS — D582 Other hemoglobinopathies: Secondary | ICD-10-CM | POA: Diagnosis not present

## 2022-06-10 DIAGNOSIS — R7989 Other specified abnormal findings of blood chemistry: Secondary | ICD-10-CM | POA: Diagnosis not present

## 2022-06-10 DIAGNOSIS — E559 Vitamin D deficiency, unspecified: Secondary | ICD-10-CM | POA: Diagnosis not present

## 2022-12-29 DIAGNOSIS — L570 Actinic keratosis: Secondary | ICD-10-CM | POA: Diagnosis not present

## 2022-12-29 DIAGNOSIS — D225 Melanocytic nevi of trunk: Secondary | ICD-10-CM | POA: Diagnosis not present

## 2022-12-29 DIAGNOSIS — D2262 Melanocytic nevi of left upper limb, including shoulder: Secondary | ICD-10-CM | POA: Diagnosis not present

## 2022-12-29 DIAGNOSIS — D2261 Melanocytic nevi of right upper limb, including shoulder: Secondary | ICD-10-CM | POA: Diagnosis not present

## 2022-12-29 DIAGNOSIS — D2271 Melanocytic nevi of right lower limb, including hip: Secondary | ICD-10-CM | POA: Diagnosis not present

## 2023-02-14 ENCOUNTER — Encounter: Payer: BC Managed Care – PPO | Admitting: Family Medicine

## 2024-01-01 DIAGNOSIS — D485 Neoplasm of uncertain behavior of skin: Secondary | ICD-10-CM | POA: Diagnosis not present

## 2024-01-01 DIAGNOSIS — D2272 Melanocytic nevi of left lower limb, including hip: Secondary | ICD-10-CM | POA: Diagnosis not present

## 2024-01-01 DIAGNOSIS — D0462 Carcinoma in situ of skin of left upper limb, including shoulder: Secondary | ICD-10-CM | POA: Diagnosis not present

## 2024-01-01 DIAGNOSIS — D2261 Melanocytic nevi of right upper limb, including shoulder: Secondary | ICD-10-CM | POA: Diagnosis not present

## 2024-01-01 DIAGNOSIS — D2262 Melanocytic nevi of left upper limb, including shoulder: Secondary | ICD-10-CM | POA: Diagnosis not present

## 2024-01-01 DIAGNOSIS — D225 Melanocytic nevi of trunk: Secondary | ICD-10-CM | POA: Diagnosis not present

## 2024-01-01 DIAGNOSIS — L570 Actinic keratosis: Secondary | ICD-10-CM | POA: Diagnosis not present

## 2024-01-29 DIAGNOSIS — D0461 Carcinoma in situ of skin of right upper limb, including shoulder: Secondary | ICD-10-CM | POA: Diagnosis not present
